# Patient Record
Sex: Male | Born: 1940 | Race: White | Hispanic: No | Marital: Married | Smoking: Former smoker
Health system: Southern US, Community
[De-identification: ages and names within clinical notes are randomized; demographics above are authoritative.]

## PROBLEM LIST (undated history)

## (undated) DIAGNOSIS — C801 Malignant (primary) neoplasm, unspecified: Secondary | ICD-10-CM

## (undated) DIAGNOSIS — J387 Other diseases of larynx: Secondary | ICD-10-CM

## (undated) DIAGNOSIS — J302 Other seasonal allergic rhinitis: Secondary | ICD-10-CM

## (undated) DIAGNOSIS — I351 Nonrheumatic aortic (valve) insufficiency: Secondary | ICD-10-CM

## (undated) DIAGNOSIS — Z87442 Personal history of urinary calculi: Secondary | ICD-10-CM

## (undated) DIAGNOSIS — Z86711 Personal history of pulmonary embolism: Secondary | ICD-10-CM

## (undated) DIAGNOSIS — D141 Benign neoplasm of larynx: Secondary | ICD-10-CM

## (undated) HISTORY — PX: TRANSTHORACIC ECHOCARDIOGRAM: SHX275

## (undated) HISTORY — PX: LARYNGOSCOPY: SHX5203

## (undated) HISTORY — PX: RADIOACTIVE SEED IMPLANT: SHX5150

## (undated) HISTORY — PX: COLONOSCOPY: SHX174

---

## 2004-12-16 DIAGNOSIS — C801 Malignant (primary) neoplasm, unspecified: Secondary | ICD-10-CM

## 2004-12-16 HISTORY — DX: Malignant (primary) neoplasm, unspecified: C80.1

## 2005-12-16 DIAGNOSIS — Z86711 Personal history of pulmonary embolism: Secondary | ICD-10-CM

## 2005-12-16 HISTORY — DX: Personal history of pulmonary embolism: Z86.711

## 2010-09-05 ENCOUNTER — Ambulatory Visit (HOSPITAL_COMMUNITY): Admission: RE | Admit: 2010-09-05 | Discharge: 2010-09-05 | Payer: Self-pay | Admitting: Otolaryngology

## 2011-02-28 LAB — CBC
MCH: 32.3 pg (ref 26.0–34.0)
Platelets: 165 10*3/uL (ref 150–400)
RBC: 4.52 MIL/uL (ref 4.22–5.81)
RDW: 13.1 % (ref 11.5–15.5)

## 2011-02-28 LAB — APTT: aPTT: 24 seconds (ref 24–37)

## 2011-02-28 LAB — SURGICAL PCR SCREEN
MRSA, PCR: NEGATIVE
Staphylococcus aureus: NEGATIVE

## 2011-02-28 LAB — COMPREHENSIVE METABOLIC PANEL
AST: 22 U/L (ref 0–37)
Albumin: 3.9 g/dL (ref 3.5–5.2)
Calcium: 9.3 mg/dL (ref 8.4–10.5)
Chloride: 107 mEq/L (ref 96–112)
Creatinine, Ser: 0.82 mg/dL (ref 0.4–1.5)
GFR calc Af Amer: >60 mL/min (ref >60)
Total Bilirubin: 0.9 mg/dL (ref 0.3–1.2)
Total Protein: 6.6 g/dL (ref 6.0–8.3)

## 2011-02-28 LAB — PROTIME-INR: INR: 0.94 (ref 0.00–1.49)

## 2012-05-14 ENCOUNTER — Encounter (HOSPITAL_BASED_OUTPATIENT_CLINIC_OR_DEPARTMENT_OTHER): Payer: Self-pay | Admitting: *Deleted

## 2012-05-14 NOTE — Progress Notes (Signed)
Pt on coumadin for hx 2 PE Will need pt ptt am surg-coming in 2 hr early since surg on a monday

## 2012-05-17 NOTE — H&P (Signed)
  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 Dysphonia (784.42). Discussed  His voice started getting much worse in the past month or 2. He is ready to schedule laser removal of the papilloma. Indirect examination reveals mobile vocal cords, with a papillomatous growth along the right anterior half of the cord, around the commissure anteriorly, and then involving the very anterior edge of the left cord. Recommend we scheduled him for laser laryngoscopy. Reason For Visit  Recheck rapsy voice. Allergies  Claritin TABS. Current Meds  Warfarin Sodium TABS;; RPT. 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 PMH  Prostate Cancer (V10.46); radiation therapy. Personal Hx  Alcohol Use; 2 drinks or less Never A Smoker. ROS  Otolaryngeal: Hoarseness. Vital Signs   Recorded by [facility] on 04 May 2012 02:04 PM BP:110/70,  Height: 71.5 in, Weight: 185 lb, BMI: 25.4 kg/m2,  BSA Calculated: 2.05 ,  BMI Calculated: 25.44. Signature  Electronically signed by : Serena Colonel  M.D.; 05/04/2012 5:31 PM EST. Electronically signed by : Serena Colonel  M.D.; 05/04/2012 5:32 PM EST.

## 2012-05-18 ENCOUNTER — Encounter (HOSPITAL_BASED_OUTPATIENT_CLINIC_OR_DEPARTMENT_OTHER)
Admission: RE | Disposition: A | Discharge status: Home / Self Care | Payer: Self-pay | Source: Ambulatory Visit | Attending: Otolaryngology

## 2012-05-18 ENCOUNTER — Ambulatory Visit (HOSPITAL_BASED_OUTPATIENT_CLINIC_OR_DEPARTMENT_OTHER): Payer: Medicare Other | Admitting: Anesthesiology

## 2012-05-18 ENCOUNTER — Encounter (HOSPITAL_BASED_OUTPATIENT_CLINIC_OR_DEPARTMENT_OTHER): Payer: Self-pay

## 2012-05-18 ENCOUNTER — Encounter (HOSPITAL_BASED_OUTPATIENT_CLINIC_OR_DEPARTMENT_OTHER): Payer: Self-pay | Admitting: Anesthesiology

## 2012-05-18 ENCOUNTER — Ambulatory Visit (HOSPITAL_BASED_OUTPATIENT_CLINIC_OR_DEPARTMENT_OTHER)
Admission: RE | Admit: 2012-05-18 | Discharge: 2012-05-18 | Disposition: A | Discharge status: Home / Self Care | Payer: Medicare Other | Source: Ambulatory Visit | Attending: Otolaryngology | Admitting: Otolaryngology

## 2012-05-18 DIAGNOSIS — J381 Polyp of vocal cord and larynx: Secondary | ICD-10-CM | POA: Insufficient documentation

## 2012-05-18 DIAGNOSIS — D141 Benign neoplasm of larynx: Secondary | ICD-10-CM | POA: Insufficient documentation

## 2012-05-18 DIAGNOSIS — R490 Dysphonia: Secondary | ICD-10-CM | POA: Insufficient documentation

## 2012-05-18 DIAGNOSIS — Z8546 Personal history of malignant neoplasm of prostate: Secondary | ICD-10-CM | POA: Insufficient documentation

## 2012-05-18 HISTORY — DX: Other seasonal allergic rhinitis: J30.2

## 2012-05-18 HISTORY — DX: Malignant (primary) neoplasm, unspecified: C80.1

## 2012-05-18 HISTORY — DX: Personal history of pulmonary embolism: Z86.711

## 2012-05-18 LAB — PROTIME-INR
INR: 2.2 — ABNORMAL HIGH (ref 0.00–1.49)
Prothrombin Time: 24.8 seconds — ABNORMAL HIGH (ref 11.6–15.2)

## 2012-05-18 SURGERY — MICROLARYNGOSCOPY WITH CO2 LASER AND EXCISION OF VOCAL CORD LESION
Anesthesia: General | Site: Throat | Wound class: Clean Contaminated

## 2012-05-18 MED ORDER — PROPOFOL 10 MG/ML IV EMUL
INTRAVENOUS | Status: DC | PRN
  Administered 2012-05-18: 150 mg via INTRAVENOUS

## 2012-05-18 MED ORDER — LACTATED RINGERS IV SOLN
INTRAVENOUS | Status: DC
  Administered 2012-05-18: 07:00:00 via INTRAVENOUS

## 2012-05-18 MED ORDER — ACETAMINOPHEN 325 MG PO TABS
325.0000 mg | ORAL_TABLET | Freq: Four times a day (QID) | ORAL | Status: DC | PRN
  Administered 2012-05-18: 650 mg via ORAL

## 2012-05-18 MED ORDER — DEXAMETHASONE SODIUM PHOSPHATE 4 MG/ML IJ SOLN
INTRAMUSCULAR | Status: DC | PRN
  Administered 2012-05-18: 10 mg via INTRAVENOUS

## 2012-05-18 MED ORDER — FENTANYL CITRATE 0.05 MG/ML IJ SOLN
INTRAMUSCULAR | Status: DC | PRN
  Administered 2012-05-18: 50 ug via INTRAVENOUS

## 2012-05-18 MED ORDER — LIDOCAINE HCL (CARDIAC) 20 MG/ML IV SOLN
INTRAVENOUS | Status: DC | PRN
  Administered 2012-05-18: 50 mg via INTRAVENOUS

## 2012-05-18 MED ORDER — LACTATED RINGERS IV SOLN
INTRAVENOUS | Status: DC | PRN
  Administered 2012-05-18 (×2): via INTRAVENOUS

## 2012-05-18 MED ORDER — ROCURONIUM BROMIDE 100 MG/10ML IV SOLN
INTRAVENOUS | Status: DC | PRN
  Administered 2012-05-18: 20 mg via INTRAVENOUS
  Administered 2012-05-18: 10 mg via INTRAVENOUS

## 2012-05-18 MED ORDER — GLYCOPYRROLATE 0.2 MG/ML IJ SOLN
INTRAMUSCULAR | Status: DC | PRN
  Administered 2012-05-18: 0.4 mg via INTRAVENOUS

## 2012-05-18 MED ORDER — MIDAZOLAM HCL 5 MG/5ML IJ SOLN
INTRAMUSCULAR | Status: DC | PRN
  Administered 2012-05-18: 2 mg via INTRAVENOUS

## 2012-05-18 MED ORDER — EPINEPHRINE HCL 1 MG/ML IJ SOLN
INTRAMUSCULAR | Status: DC | PRN
  Administered 2012-05-18: 1 mg via SUBCUTANEOUS

## 2012-05-18 MED ORDER — NEOSTIGMINE METHYLSULFATE 1 MG/ML IJ SOLN
INTRAMUSCULAR | Status: DC | PRN
  Administered 2012-05-18: 3.5 mg via INTRAVENOUS

## 2012-05-18 MED ORDER — FENTANYL CITRATE 0.05 MG/ML IJ SOLN: 25.0000 ug | INTRAMUSCULAR | Status: DC | PRN

## 2012-05-18 MED ORDER — SUCCINYLCHOLINE CHLORIDE 20 MG/ML IJ SOLN
INTRAMUSCULAR | Status: DC | PRN
  Administered 2012-05-18: 100 mg via INTRAVENOUS

## 2012-05-18 MED ORDER — ONDANSETRON HCL 4 MG/2ML IJ SOLN
INTRAMUSCULAR | Status: DC | PRN
  Administered 2012-05-18: 4 mg via INTRAVENOUS

## 2012-05-18 SURGICAL SUPPLY — 25 items
CANISTER SUCTION 1200CC (MISCELLANEOUS) ×2 IMPLANT
CLOTH BEACON ORANGE TIMEOUT ST (SAFETY) ×2 IMPLANT
GAUZE SPONGE 4X4 12PLY STRL LF (GAUZE/BANDAGES/DRESSINGS) ×4 IMPLANT
GLOVE ECLIPSE 7.5 STRL STRAW (GLOVE) ×2 IMPLANT
GLOVE SKINSENSE NS SZ7.0 (GLOVE) ×1
GLOVE SKINSENSE STRL SZ7.0 (GLOVE) ×1 IMPLANT
GOWN PREVENTION PLUS XLARGE (GOWN DISPOSABLE) IMPLANT
GOWN PREVENTION PLUS XXLARGE (GOWN DISPOSABLE) IMPLANT
GUARD TEETH (MISCELLANEOUS) ×4 IMPLANT
MARKER SKIN DUAL TIP RULER LAB (MISCELLANEOUS) IMPLANT
NEEDLE HYPO 18GX1.5 BLUNT FILL (NEEDLE) IMPLANT
NEEDLE SPNL 22GX7 QUINCKE BK (NEEDLE) IMPLANT
NS IRRIG 1000ML POUR BTL (IV SOLUTION) ×2 IMPLANT
PATTIES SURGICAL .5 X3 (DISPOSABLE) ×2 IMPLANT
REDUCTION FITTING 1/4 IN (FILTER) ×2 IMPLANT
SHEET MEDIUM DRAPE 40X70 STRL (DRAPES) ×2 IMPLANT
SLEEVE SCD COMPRESS KNEE MED (MISCELLANEOUS) ×2 IMPLANT
SOLUTION BUTLER CLEAR DIP (MISCELLANEOUS) ×2 IMPLANT
SURGILUBE 2OZ TUBE FLIPTOP (MISCELLANEOUS) IMPLANT
SYR 5ML LL (SYRINGE) IMPLANT
SYR CONTROL 10ML LL (SYRINGE) IMPLANT
SYR TB 1ML LL NO SAFETY (SYRINGE) ×2 IMPLANT
TOWEL OR 17X24 6PK STRL BLUE (TOWEL DISPOSABLE) IMPLANT
TUBE CONNECTING 20X1/4 (TUBING) ×2 IMPLANT
WATER STERILE IRR 1000ML POUR (IV SOLUTION) IMPLANT

## 2012-05-18 NOTE — Progress Notes (Signed)
Notified Dr. Pollyann Kennedy no script for pain on chart on charted -- use tylenol or ibuprofen for pain) Pt will use tylenol due to pt on coumadin.

## 2012-05-18 NOTE — Anesthesia Preprocedure Evaluation (Signed)
Anesthesia Evaluation  Patient identified by MRN, date of birth, ID band Patient awake    Reviewed: Allergy & Precautions, H&P , NPO status , Patient's Chart, lab work & pertinent test results  Airway Mallampati: II TM Distance: >3 FB Neck ROM: Full    Dental No notable dental hx. (+) Teeth Intact and Dental Advisory Given   Pulmonary neg pulmonary ROS,  breath sounds clear to auscultation  Pulmonary exam normal       Cardiovascular negative cardio ROS  Rhythm:Regular Rate:Normal     Neuro/Psych negative neurological ROS  negative psych ROS   GI/Hepatic negative GI ROS, Neg liver ROS,   Endo/Other  negative endocrine ROS  Renal/GU negative Renal ROS  negative genitourinary   Musculoskeletal   Abdominal   Peds  Hematology negative hematology ROS (+)   Anesthesia Other Findings   Reproductive/Obstetrics negative OB ROS                           Anesthesia Physical Anesthesia Plan  ASA: II  Anesthesia Plan: General   Post-op Pain Management:    Induction: Intravenous  Airway Management Planned: Oral ETT  Additional Equipment:   Intra-op Plan:   Post-operative Plan: Extubation in OR  Informed Consent: I have reviewed the patients History and Physical, chart, labs and discussed the procedure including the risks, benefits and alternatives for the proposed anesthesia with the patient or authorized representative who has indicated his/her understanding and acceptance.   Dental advisory given  Plan Discussed with: CRNA  Anesthesia Plan Comments:        Anesthesia Quick Evaluation  

## 2012-05-18 NOTE — Transfer of Care (Signed)
Immediate Anesthesia Transfer of Care Note  Patient: Kent Davis  Procedure(s) Performed: Procedure(s) (LRB): MICROLARYNGOSCOPY WITH CO2 LASER AND EXCISION OF VOCAL CORD LESION (N/A)  Patient Location: PACU  Anesthesia Type: General  Level of Consciousness: awake  Airway & Oxygen Therapy: Patient Spontanous Breathing and Patient connected to face mask oxygen  Post-op Assessment: Report given to PACU RN and Post -op Vital signs reviewed and stable  Post vital signs: Reviewed and stable  Complications: No apparent anesthesia complications

## 2012-05-18 NOTE — Interval H&P Note (Signed)
History and Physical Interval Note:  05/18/2012 7:31 AM  Kent Davis  has presented today for surgery, with the diagnosis of vocal cord polyp  The various methods of treatment have been discussed with the patient and family. After consideration of risks, benefits and other options for treatment, the patient has consented to  Procedure(s) (LRB): MICROLARYNGOSCOPY WITH CO2 LASER AND EXCISION OF VOCAL CORD LESION (N/A) as a surgical intervention .  The patients' history has been reviewed, patient examined, no change in status, stable for surgery.  I have reviewed the patients' chart and labs.  Questions were answered to the patient's satisfaction.     Brennen Camper

## 2012-05-18 NOTE — Discharge Instructions (Addendum)
Rest your voice for the next 2 weeks, no screaming, whispering or straining. You may cough at a gentle and comfortable level  but not  Excessively.   Post Anesthesia Home Care Instructions  Activity: Get plenty of rest for the remainder of the day. A responsible adult should stay with you for 24 hours following the procedure.  For the next 24 hours, DO NOT: -Drive a car -Advertising copywriter -Drink alcoholic beverages -Take any medication unless instructed by your physician -Make any legal decisions or sign important papers.  Meals: Start with liquid foods such as gelatin or soup. Progress to regular foods as tolerated. Avoid greasy, spicy, heavy foods. If nausea and/or vomiting occur, drink only clear liquids until the nausea and/or vomiting subsides. Call your physician if vomiting continues.  Special Instructions/Symptoms: Your throat may feel dry or sore from the anesthesia or the breathing tube placed in your throat during surgery. If this causes discomfort, gargle with warm salt water. The discomfort should disappear within 24 hours.

## 2012-05-18 NOTE — Op Note (Signed)
OPERATIVE REPORT  DATE OF SURGERY: 05/18/2012  PATIENT:  Kent Davis,  71 y.o. male  PRE-OPERATIVE DIAGNOSIS:  vocal cord polyp  POST-OPERATIVE DIAGNOSIS:  vocal cord polyp  PROCEDURE:  Procedure(s): MICROLARYNGOSCOPY WITH CO2 LASER AND EXCISION OF VOCAL CORD LESION  SURGEON:  Susy Frizzle, MD  ASSISTANTS: none   ANESTHESIA:   general  EBL:  0 ml  DRAINS: none   LOCAL MEDICATIONS USED:  OTHER adrenaline solution, 12-998.  SPECIMEN:  Source of Specimen:  Right vocal cord  COUNTS:  YES  PROCEDURE DETAILS: Patient was taken to the operating room and placed on the operating table in the supine position. Following induction of general endotracheal anesthesia, the table was turned 90 and drapes were applied including saline soaked eye pads and facial towels. Maxillary and mandibular tooth protectors were used. A laser safe laryngoscope was entered into the oral cavity used to visualize the larynx and secured to the [place] stand using the suspension apparatus. A laser safe tube was used throughout the case. The microscope was used to visualize the larynx. There was papillomatous type growth along the right anterior half of the vocal cord, slightly into the subglottis, and into the floor of the ventricle on the right side. A biopsy was taken and sent for pathologic evaluation. The carbon dioxide laser was used at a setting of 4 W continuous power to ablate all of the visible papillomatous tissue. Topical adrenaline was used as needed. The patient was then awakened, extubated and transferred to recovery in stable condition.   PATIENT DISPOSITION:  PACU - hemodynamically stable.

## 2012-05-18 NOTE — Anesthesia Postprocedure Evaluation (Signed)
  Anesthesia Post-op Note  Patient: Kent Davis  Procedure(s) Performed: Procedure(s) (LRB): MICROLARYNGOSCOPY WITH CO2 LASER AND EXCISION OF VOCAL CORD LESION (N/A)  Patient Location: PACU  Anesthesia Type: General  Level of Consciousness: awake  Airway and Oxygen Therapy: Patient Spontanous Breathing and aerosol face mask  Post-op Pain: none  Post-op Assessment: Post-op Vital signs reviewed, Patient's Cardiovascular Status Stable, Respiratory Function Stable and Patent Airway  Post-op Vital Signs: Reviewed and stable  Complications: No apparent anesthesia complications

## 2012-08-12 ENCOUNTER — Inpatient Hospital Stay (HOSPITAL_COMMUNITY): Admit: 2012-08-12 | Payer: Self-pay

## 2012-08-14 ENCOUNTER — Other Ambulatory Visit: Payer: Self-pay | Admitting: Internal Medicine

## 2012-08-14 DIAGNOSIS — E041 Nontoxic single thyroid nodule: Secondary | ICD-10-CM

## 2012-08-19 ENCOUNTER — Ambulatory Visit
Admission: RE | Admit: 2012-08-19 | Discharge: 2012-08-19 | Disposition: A | Discharge status: Home / Self Care | Payer: Medicare Other | Source: Ambulatory Visit | Attending: Internal Medicine | Admitting: Internal Medicine

## 2012-08-19 DIAGNOSIS — E041 Nontoxic single thyroid nodule: Secondary | ICD-10-CM

## 2012-08-25 ENCOUNTER — Other Ambulatory Visit: Payer: Self-pay | Admitting: Internal Medicine

## 2012-08-25 DIAGNOSIS — E042 Nontoxic multinodular goiter: Secondary | ICD-10-CM

## 2012-09-16 ENCOUNTER — Other Ambulatory Visit (HOSPITAL_COMMUNITY)
Admission: RE | Admit: 2012-09-16 | Discharge: 2012-09-16 | Disposition: A | Discharge status: Home / Self Care | Payer: Medicare Other | Source: Ambulatory Visit | Attending: Interventional Radiology | Admitting: Interventional Radiology

## 2012-09-16 ENCOUNTER — Telehealth: Payer: Self-pay | Admitting: Emergency Medicine

## 2012-09-16 ENCOUNTER — Ambulatory Visit
Admission: RE | Admit: 2012-09-16 | Discharge: 2012-09-16 | Disposition: A | Discharge status: Home / Self Care | Payer: Medicare Other | Source: Ambulatory Visit | Attending: Internal Medicine | Admitting: Internal Medicine

## 2012-09-16 DIAGNOSIS — E049 Nontoxic goiter, unspecified: Secondary | ICD-10-CM | POA: Insufficient documentation

## 2012-09-16 DIAGNOSIS — E042 Nontoxic multinodular goiter: Secondary | ICD-10-CM

## 2012-09-16 NOTE — Telephone Encounter (Signed)
Kent Davis W/ [facility] NEXT DOOR CALLED IN THE INR- 1.05  PT- 13.3

## 2014-11-24 ENCOUNTER — Ambulatory Visit: Payer: Self-pay | Admitting: Otolaryngology

## 2014-11-24 NOTE — H&P (Signed)
  Assessment  Anticoagulated on Coumadin (V58.83,V58.61) (Z51.81,Z79.01). 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 Discussed  He has been getting quite hoarse again. He takes Coumadin because he had pulmonary embolus twice in the past. On exam, his voice is strained and hoarse. He has no stridor. Oral cavity and pharynx look normal. Indirect exam of the larynx reveals papilloma involving the midportion of the left cord and the anterior fourth bilaterally. Otherwise normal exam. Recommend laser excision. We will talk to his primary care about stopping the Coumadin for one week. Reason For Visit  Recheck vocal cords. Allergies  Claritin TABS. Current Meds  Warfarin Sodium TABS;; RPT. 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 History of Polyp of vocal cord or larynx (478.4) (J38.1); Resolved: 13Nov2015. PSH  Laryngoscopy (Diagnostic) 03Jun2013; removal vocal cord polyp. Family Hx  No pertinent family history: Mother. Personal Hx  Alcohol Use (History); 2 drinks or less Caffeine use (V49.89) (F15.90); 2 cups daily Never a smoker No alcohol use 13Nov2015. Vital Signs   Recorded by Skolimowski,Sharon on 28 Oct 2014 02:05 PM BP:110/60,  Height: 5 ft 11.5 in, Weight: 186 lb , BMI: 25.6 kg/m2,  BMI Calculated: 25.58 ,  BSA Calculated: 2.06. Signature  Electronically signed by : Shamila Lerch  M.D.; 10/28/2014 2:19 PM EST.  

## 2014-12-05 ENCOUNTER — Encounter (HOSPITAL_COMMUNITY): Payer: Self-pay | Admitting: *Deleted

## 2014-12-05 NOTE — Progress Notes (Signed)
I received a fax back that read,"need signed release".  I called Dr Roddie Mc office and explained that patient's surgery is in am and that Dr Gregary Signs sent a release, we need EKG, Echo and stress test- patient reported all were done this year.  I was told that they will call patient to get permission and then fax the records.

## 2014-12-05 NOTE — Progress Notes (Signed)
PCP is Dr Julio Sicks in [place].  Mr Squillace reports having an ekg, stress test and echo at PCP's office this year, I requested reports.

## 2014-12-06 ENCOUNTER — Ambulatory Visit (HOSPITAL_COMMUNITY): Payer: Medicare PPO | Admitting: Certified Registered Nurse Anesthetist

## 2014-12-06 ENCOUNTER — Encounter (HOSPITAL_COMMUNITY): Payer: Self-pay

## 2014-12-06 ENCOUNTER — Encounter (HOSPITAL_COMMUNITY)
Admission: RE | Disposition: A | Discharge status: Home / Self Care | Payer: Self-pay | Source: Ambulatory Visit | Attending: Otolaryngology

## 2014-12-06 ENCOUNTER — Ambulatory Visit (HOSPITAL_COMMUNITY)
Admission: RE | Admit: 2014-12-06 | Discharge: 2014-12-06 | Disposition: A | Discharge status: Home / Self Care | Payer: Medicare PPO | Source: Ambulatory Visit | Attending: Otolaryngology | Admitting: Otolaryngology

## 2014-12-06 DIAGNOSIS — D141 Benign neoplasm of larynx: Secondary | ICD-10-CM | POA: Insufficient documentation

## 2014-12-06 DIAGNOSIS — Z8546 Personal history of malignant neoplasm of prostate: Secondary | ICD-10-CM | POA: Insufficient documentation

## 2014-12-06 DIAGNOSIS — Z923 Personal history of irradiation: Secondary | ICD-10-CM | POA: Diagnosis not present

## 2014-12-06 DIAGNOSIS — Z7901 Long term (current) use of anticoagulants: Secondary | ICD-10-CM | POA: Diagnosis not present

## 2014-12-06 DIAGNOSIS — Z86711 Personal history of pulmonary embolism: Secondary | ICD-10-CM | POA: Insufficient documentation

## 2014-12-06 HISTORY — PX: MICROLARYNGOSCOPY: SHX5208

## 2014-12-06 LAB — CBC
HCT: 43.5 % (ref 39.0–52.0)
HEMOGLOBIN: 15 g/dL (ref 13.0–17.0)
MCH: 31.9 pg (ref 26.0–34.0)
MCHC: 34.5 g/dL (ref 30.0–36.0)
MCV: 92.6 fL (ref 78.0–100.0)
Platelets: 182 10*3/uL (ref 150–400)
RBC: 4.7 MIL/uL (ref 4.22–5.81)
RDW: 12.9 % (ref 11.5–15.5)
WBC: 4.2 10*3/uL (ref 4.0–10.5)

## 2014-12-06 LAB — PROTIME-INR
INR: 1 (ref 0.00–1.49)
PROTHROMBIN TIME: 13.3 s (ref 11.6–15.2)

## 2014-12-06 SURGERY — MICROLARYNGOSCOPY
Anesthesia: General | Laterality: Bilateral

## 2014-12-06 MED ORDER — LACTATED RINGERS IV SOLN
INTRAVENOUS | Status: DC | PRN
  Administered 2014-12-06: 08:00:00 via INTRAVENOUS

## 2014-12-06 MED ORDER — OXYCODONE HCL 5 MG/5ML PO SOLN: 5.0000 mg | Freq: Once | ORAL | Status: DC | PRN

## 2014-12-06 MED ORDER — ONDANSETRON HCL 4 MG/2ML IJ SOLN
INTRAMUSCULAR | Status: AC
  Filled 2014-12-06: qty 2

## 2014-12-06 MED ORDER — OXYCODONE HCL 5 MG PO TABS: 5.0000 mg | ORAL_TABLET | Freq: Once | ORAL | Status: DC | PRN

## 2014-12-06 MED ORDER — MIDAZOLAM HCL 2 MG/2ML IJ SOLN
INTRAMUSCULAR | Status: AC
  Filled 2014-12-06: qty 2

## 2014-12-06 MED ORDER — LIDOCAINE HCL (CARDIAC) 20 MG/ML IV SOLN
INTRAVENOUS | Status: AC
  Filled 2014-12-06: qty 5

## 2014-12-06 MED ORDER — GLYCOPYRROLATE 0.2 MG/ML IJ SOLN
INTRAMUSCULAR | Status: DC | PRN
  Administered 2014-12-06: 0.4 mg via INTRAVENOUS

## 2014-12-06 MED ORDER — SUCCINYLCHOLINE CHLORIDE 20 MG/ML IJ SOLN
INTRAMUSCULAR | Status: DC | PRN
  Administered 2014-12-06: 100 mg via INTRAVENOUS

## 2014-12-06 MED ORDER — LIDOCAINE-EPINEPHRINE 1 %-1:100000 IJ SOLN
INTRAMUSCULAR | Status: AC
  Filled 2014-12-06: qty 1

## 2014-12-06 MED ORDER — TRIAMCINOLONE ACETONIDE 40 MG/ML IJ SUSP
INTRAMUSCULAR | Status: AC
  Filled 2014-12-06: qty 5

## 2014-12-06 MED ORDER — ONDANSETRON HCL 4 MG/2ML IJ SOLN: 4.0000 mg | Freq: Once | INTRAMUSCULAR | Status: DC | PRN

## 2014-12-06 MED ORDER — EPHEDRINE SULFATE 50 MG/ML IJ SOLN
INTRAMUSCULAR | Status: AC
  Filled 2014-12-06: qty 1

## 2014-12-06 MED ORDER — NEOSTIGMINE METHYLSULFATE 10 MG/10ML IV SOLN
INTRAVENOUS | Status: AC
  Filled 2014-12-06: qty 1

## 2014-12-06 MED ORDER — LIDOCAINE HCL (CARDIAC) 20 MG/ML IV SOLN
INTRAVENOUS | Status: DC | PRN
  Administered 2014-12-06: 50 mg via INTRAVENOUS

## 2014-12-06 MED ORDER — FENTANYL CITRATE 0.05 MG/ML IJ SOLN
25.0000 ug | INTRAMUSCULAR | Status: DC | PRN
Start: 2014-12-06 — End: 2014-12-06

## 2014-12-06 MED ORDER — PROPOFOL 10 MG/ML IV BOLUS
INTRAVENOUS | Status: AC
  Filled 2014-12-06: qty 20

## 2014-12-06 MED ORDER — EPINEPHRINE HCL (NASAL) 0.1 % NA SOLN
NASAL | Status: AC
  Filled 2014-12-06: qty 30

## 2014-12-06 MED ORDER — DEXAMETHASONE SODIUM PHOSPHATE 10 MG/ML IJ SOLN
INTRAMUSCULAR | Status: AC
  Filled 2014-12-06: qty 1

## 2014-12-06 MED ORDER — FENTANYL CITRATE 0.05 MG/ML IJ SOLN
INTRAMUSCULAR | Status: DC | PRN
Start: 2014-12-06 — End: 2014-12-06
  Administered 2014-12-06: 100 ug via INTRAVENOUS
  Administered 2014-12-06: 25 ug via INTRAVENOUS

## 2014-12-06 MED ORDER — SODIUM CHLORIDE 0.9 % IJ SOLN
INTRAMUSCULAR | Status: AC
  Filled 2014-12-06: qty 10

## 2014-12-06 MED ORDER — NEOSTIGMINE METHYLSULFATE 10 MG/10ML IV SOLN
INTRAVENOUS | Status: DC | PRN
Start: 2014-12-06 — End: 2014-12-06
  Administered 2014-12-06: 3 mg via INTRAVENOUS

## 2014-12-06 MED ORDER — ONDANSETRON HCL 4 MG/2ML IJ SOLN
INTRAMUSCULAR | Status: DC | PRN
  Administered 2014-12-06: 4 mg via INTRAVENOUS

## 2014-12-06 MED ORDER — DEXAMETHASONE SODIUM PHOSPHATE 10 MG/ML IJ SOLN
INTRAMUSCULAR | Status: DC | PRN
  Administered 2014-12-06: 10 mg via INTRAVENOUS

## 2014-12-06 MED ORDER — ROCURONIUM BROMIDE 50 MG/5ML IV SOLN
INTRAVENOUS | Status: AC
  Filled 2014-12-06: qty 1

## 2014-12-06 MED ORDER — ROCURONIUM BROMIDE 100 MG/10ML IV SOLN
INTRAVENOUS | Status: DC | PRN
  Administered 2014-12-06: 15 mg via INTRAVENOUS

## 2014-12-06 MED ORDER — 0.9 % SODIUM CHLORIDE (POUR BTL) OPTIME
TOPICAL | Status: DC | PRN
  Administered 2014-12-06: 1000 mL

## 2014-12-06 MED ORDER — PROPOFOL 10 MG/ML IV BOLUS
INTRAVENOUS | Status: DC | PRN
  Administered 2014-12-06: 50 mg via INTRAVENOUS
  Administered 2014-12-06: 200 mg via INTRAVENOUS

## 2014-12-06 MED ORDER — FENTANYL CITRATE 0.05 MG/ML IJ SOLN
INTRAMUSCULAR | Status: AC
  Filled 2014-12-06: qty 5

## 2014-12-06 MED ORDER — PHENYLEPHRINE HCL 10 MG/ML IJ SOLN
INTRAMUSCULAR | Status: DC | PRN
  Administered 2014-12-06: 40 ug via INTRAVENOUS

## 2014-12-06 MED ORDER — EPINEPHRINE HCL (NASAL) 0.1 % NA SOLN
NASAL | Status: DC | PRN
  Administered 2014-12-06: 30 mL via TOPICAL

## 2014-12-06 MED ORDER — GLYCOPYRROLATE 0.2 MG/ML IJ SOLN
INTRAMUSCULAR | Status: AC
  Filled 2014-12-06: qty 3

## 2014-12-06 SURGICAL SUPPLY — 31 items
CANISTER SUCTION 2500CC (MISCELLANEOUS) ×3 IMPLANT
CONT SPEC 4OZ CLIKSEAL STRL BL (MISCELLANEOUS) ×6 IMPLANT
COVER MAYO STAND STRL (DRAPES) ×3 IMPLANT
COVER TABLE BACK 60X90 (DRAPES) ×3 IMPLANT
DRAPE PROXIMA HALF (DRAPES) ×3 IMPLANT
DRESSING TELFA 8X3 (GAUZE/BANDAGES/DRESSINGS) IMPLANT
ELECT REM PT RETURN 9FT ADLT (ELECTROSURGICAL)
ELECTRODE REM PT RTRN 9FT ADLT (ELECTROSURGICAL) IMPLANT
GAUZE SPONGE 4X4 16PLY XRAY LF (GAUZE/BANDAGES/DRESSINGS) ×3 IMPLANT
GLOVE ECLIPSE 7.5 STRL STRAW (GLOVE) ×3 IMPLANT
GUARD TEETH (MISCELLANEOUS) ×3 IMPLANT
KIT BASIN OR (CUSTOM PROCEDURE TRAY) ×3 IMPLANT
KIT ROOM TURNOVER OR (KITS) ×3 IMPLANT
MARKER SKIN DUAL TIP RULER LAB (MISCELLANEOUS) IMPLANT
NEEDLE HYPO 25GX1X1/2 BEV (NEEDLE) ×3 IMPLANT
NS IRRIG 1000ML POUR BTL (IV SOLUTION) ×3 IMPLANT
PAD ARMBOARD 7.5X6 YLW CONV (MISCELLANEOUS) ×6 IMPLANT
PAD EYE OVAL STERILE LF (GAUZE/BANDAGES/DRESSINGS) ×6 IMPLANT
PATTIES SURGICAL .5 X1 (DISPOSABLE) ×3 IMPLANT
PREFILTER SMOKE EVAC (FILTER) ×3 IMPLANT
SOLUTION ANTI FOG 6CC (MISCELLANEOUS) ×3 IMPLANT
SPECIMEN JAR SMALL (MISCELLANEOUS) ×3 IMPLANT
SYR 3ML LL SCALE MARK (SYRINGE) IMPLANT
SYR CONTROL 10ML LL (SYRINGE) IMPLANT
SYR TB 1ML LUER SLIP (SYRINGE) IMPLANT
TOWEL OR 17X24 6PK STRL BLUE (TOWEL DISPOSABLE) ×6 IMPLANT
TUBE CONNECTING 12'X1/4 (SUCTIONS) ×1
TUBE CONNECTING 12X1/4 (SUCTIONS) ×2 IMPLANT
TUBE CONNECTING 20'X1/4 (TUBING) ×1
TUBE CONNECTING 20X1/4 (TUBING) ×2 IMPLANT
WATER STERILE IRR 1000ML POUR (IV SOLUTION) ×3 IMPLANT

## 2014-12-06 NOTE — Anesthesia Preprocedure Evaluation (Addendum)
Anesthesia Evaluation  Patient identified by MRN, date of birth, ID band Patient awake    Reviewed: Allergy & Precautions, H&P , NPO status , Patient's Chart, lab work & pertinent test results  Airway Mallampati: II  TM Distance: >3 FB Neck ROM: Full    Dental  (+) Teeth Intact, Dental Advisory Given   Pulmonary former smoker,  breath sounds clear to auscultation        Cardiovascular Rhythm:Regular Rate:Normal     Neuro/Psych    GI/Hepatic   Endo/Other    Renal/GU      Musculoskeletal   Abdominal   Peds  Hematology   Anesthesia Other Findings   Reproductive/Obstetrics                            Anesthesia Physical Anesthesia Plan  ASA: III  Anesthesia Plan: General   Post-op Pain Management:    Induction: Intravenous  Airway Management Planned: Oral ETT  Additional Equipment:   Intra-op Plan:   Post-operative Plan: Extubation in OR  Informed Consent: I have reviewed the patients History and Physical, chart, labs and discussed the procedure including the risks, benefits and alternatives for the proposed anesthesia with the patient or authorized representative who has indicated his/her understanding and acceptance.   Dental advisory given  Plan Discussed with: CRNA and Anesthesiologist  Anesthesia Plan Comments:         Anesthesia Quick Evaluation

## 2014-12-06 NOTE — Op Note (Signed)
OPERATIVE REPORT  DATE OF SURGERY: 12/06/2014  PATIENT:  Kent Davis,  73 y.o. male  PRE-OPERATIVE DIAGNOSIS:  VOCAL CORD PAPILLOMA  POST-OPERATIVE DIAGNOSIS:  VOCAL CORD PAPILLOMA  PROCEDURE:  Procedure(s): MICROLARYNGOSCOPY WITH REMOVAL OF PAPILLOMA  SURGEON:  Beckie Salts, MD  ASSISTANTS: none  ANESTHESIA:   General   EBL:  3 ml  DRAINS: none  LOCAL MEDICATIONS USED:  None  SPECIMEN:  Laryngeal mass  COUNTS:  Correct  PROCEDURE DETAILS: The patient was taken to the operating room and placed on the operating table in the supine position. Following induction of general endotracheal anesthesia, the table was turned 90 and the patient was draped in a standard fashion. Saline soaked towels and eye pads were placed around the face and eyes respectively. A laser laryngoscope was entered into the oral cavity. Maxillary tooth protector was used throughout the case. The scope was used to visualize the larynx and attached to the [place] stand with the suspension apparatus. The operating microscope was brought into view. The endotracheal tube was intermittently removed and replaced through the laryngoscope in order to work in an apneic technique. The CO2 laser was used at a setting of 3 W continuous power to ablate all of the papillomatous tissue that was seen arising from the left anterior vocal cord, anterior commissure, anterior ventricle. A couple of small pieces were sent for pathologic evaluation. Topical adrenaline was used at the end of the case for hemostasis. Fairly bulky papilloma was removed especially in the anterior 2 mm of the cord. All of the visible papilloma was removed. The scope was removed. Patient was awakened extubated and transferred to recovery in stable condition.    PATIENT DISPOSITION:  To PACU, stable

## 2014-12-06 NOTE — Progress Notes (Signed)
Called Dr.Joslin for sign out ---okay to go home at this time

## 2014-12-06 NOTE — Transfer of Care (Signed)
Immediate Anesthesia Transfer of Care Note  Patient: Kent Davis  Procedure(s) Performed: Procedure(s): MICROLARYNGOSCOPY WITH REMOVAL OF PAPILLOMA (Bilateral)  Patient Location: PACU  Anesthesia Type:General  Level of Consciousness: awake, alert , oriented and patient cooperative  Airway & Oxygen Therapy: Patient Spontanous Breathing and Patient connected to nasal cannula oxygen  Post-op Assessment: Report given to PACU RN, Post -op Vital signs reviewed and stable and Patient moving all extremities  Post vital signs: Reviewed and stable  Complications: No apparent anesthesia complications

## 2014-12-06 NOTE — Interval H&P Note (Signed)
History and Physical Interval Note:  12/06/2014 8:34 AM  Kent Davis  has presented today for surgery, with the diagnosis of VOCAL CORD PAPILLOMA  The various methods of treatment have been discussed with the patient and family. After consideration of risks, benefits and other options for treatment, the patient has consented to  Procedure(s): MICROLARYNGOSCOPY WITH REMOVAL OF PAPILLOMA (Bilateral) as a surgical intervention .  The patient's history has been reviewed, patient examined, no change in status, stable for surgery.  I have reviewed the patient's chart and labs.  Questions were answered to the patient's satisfaction.     Jemia Fata

## 2014-12-06 NOTE — Anesthesia Postprocedure Evaluation (Signed)
  Anesthesia Post-op Note  Patient: Kent Davis  Procedure(s) Performed: Procedure(s): MICROLARYNGOSCOPY WITH REMOVAL OF PAPILLOMA (Bilateral)  Patient Location: PACU  Anesthesia Type:General  Level of Consciousness: awake, alert  and oriented  Airway and Oxygen Therapy: Patient Spontanous Breathing and Patient connected to nasal cannula oxygen  Post-op Pain: mild  Post-op Assessment: Post-op Vital signs reviewed, Patient's Cardiovascular Status Stable, Respiratory Function Stable, Patent Airway, No signs of Nausea or vomiting and Pain level controlled  Post-op Vital Signs: stable  Last Vitals:  Filed Vitals:   12/06/14 1049  BP: 133/75  Pulse: 71  Temp:   Resp: 22    Complications: No apparent anesthesia complications

## 2014-12-06 NOTE — Anesthesia Procedure Notes (Signed)
Procedure Name: Intubation Date/Time: 12/06/2014 8:48 AM Performed by: Shirlyn Goltz Pre-anesthesia Checklist: Patient identified, Emergency Drugs available, Suction available and Patient being monitored Patient Re-evaluated:Patient Re-evaluated prior to inductionOxygen Delivery Method: Circle system utilized Preoxygenation: Pre-oxygenation with 100% oxygen Intubation Type: IV induction Ventilation: Mask ventilation without difficulty Laryngoscope Size: Miller and 3 Grade View: Grade I Tube type: Oral Tube size: 6.0 mm Number of attempts: 1 Airway Equipment and Method: Stylet Placement Confirmation: ETT inserted through vocal cords under direct vision,  positive ETCO2 and breath sounds checked- equal and bilateral Secured at: 22 cm Tube secured with: Tape Dental Injury: Teeth and Oropharynx as per pre-operative assessment

## 2014-12-06 NOTE — H&P (View-Only) (Signed)
  Assessment  Anticoagulated on Coumadin (V58.83,V58.61) (Z51.81,Z79.01). 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 Discussed  He has been getting quite hoarse again. He takes Coumadin because he had pulmonary embolus twice in the past. On exam, his voice is strained and hoarse. He has no stridor. Oral cavity and pharynx look normal. Indirect exam of the larynx reveals papilloma involving the midportion of the left cord and the anterior fourth bilaterally. Otherwise normal exam. Recommend laser excision. We will talk to his primary care about stopping the Coumadin for one week. Reason For Visit  Recheck vocal cords. Allergies  Claritin TABS. Current Meds  Warfarin Sodium TABS;; RPT. 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 History of Polyp of vocal cord or larynx (478.4) (J38.1); Resolved: [id number]. PSH  Laryngoscopy (Diagnostic) [id number]; removal vocal cord polyp. Family Hx  No pertinent family history: Mother. Personal Hx  Alcohol Use (History); 2 drinks or less Caffeine use (V49.89) (F15.90); 2 cups daily Never a smoker No alcohol use [id number]. Vital Signs   Recorded by [facility] on 28 Oct 2014 02:05 PM BP:110/60,  Height: 5 ft 11.5 in, Weight: 186 lb , BMI: 25.6 kg/m2,  BMI Calculated: 25.58 ,  BSA Calculated: 2.06. Signature  Electronically signed by : Izora Gala  M.D.; 10/28/2014 2:19 PM EST.

## 2014-12-07 ENCOUNTER — Encounter (HOSPITAL_COMMUNITY): Payer: Self-pay | Admitting: Otolaryngology

## 2014-12-07 NOTE — Discharge Summary (Signed)
  Physician Discharge Summary  Patient ID: Kent Davis MRN: [id number] DOB/AGE: [birth year] 73 y.o.  Admit date: 12/06/2014 Discharge date: 12/07/2014  Admission Diagnoses:Laryngeal papilloma  Discharge Diagnoses:  Active Problems:   * No active hospital problems. *   Discharged Condition: good  Hospital Course: no complications  Consults: none  Significant Diagnostic Studies: none  Treatments: surgery: Laser laryngoscopy  Discharge Exam: Blood pressure 133/75, pulse 71, temperature 97.2 F (36.2 C), temperature source Oral, resp. rate 22, height 6\' 1"  (1.854 m), weight 84.823 kg (187 lb), SpO2 100 %. PHYSICAL EXAM: Awake and alert, no breathing difficulty.  Disposition: 01-Home or Self Care  Discharge Instructions    Diet - low sodium heart healthy    Complete by:  As directed      Increase activity slowly    Complete by:  As directed             Medication List    TAKE these medications        loratadine 10 MG tablet  Commonly known as:  CLARITIN  Take 10 mg by mouth daily.     warfarin 6 MG tablet  Commonly known as:  COUMADIN  Take 3-6 mg by mouth daily. 3mg  daily on Monday, Wednesday and Friday and 6mg  daily on Tuesday, Thursday, Saturday, and Sunday           Follow-up Information    Follow up with Izora Gala, MD.   Specialty:  Otolaryngology   Why:  As needed   Contact information:   [address] [place] [address] [phone]       Signed: Izora Gala 12/07/2014, 11:41 AM

## 2017-02-15 ENCOUNTER — Ambulatory Visit: Payer: Self-pay | Admitting: Otolaryngology

## 2017-02-15 NOTE — H&P (Signed)
  Kent Davis is an 75 y.o. male.   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 Getting very hoarse again, no trouble breathing.  Past Medical History:  Diagnosis Date  . 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 Hx pulmonary embolism 2007   x2-once long plane flight to [place],other playing tennis  . Seasonal allergies     Past Surgical History:  Procedure Laterality Date  . COLONOSCOPY    . LARYNGOSCOPY  9/11 and 2013   vocal cord polyp  . MICROLARYNGOSCOPY Bilateral 12/06/2014   Procedure: MICROLARYNGOSCOPY WITH REMOVAL OF PAPILLOMA;  Surgeon: Gari Trovato, MD;  Location: [facility] OR;  Service: ENT;  Laterality: Bilateral;  . RADIOACTIVE SEED IMPLANT      No family history on file. Social History:  reports that he quit smoking about 44 years ago. He does not have any smokeless tobacco history on file. He reports that he drinks about 0.6 oz of alcohol per week . He reports that he does not use drugs.  Allergies: No Known Allergies   (Not in a hospital admission)  No results found for this or any previous visit (from the past 48 hour(s)). No results found.  ROS: otherwise negative  There were no vitals taken for this visit.  PHYSICAL EXAM: Overall appearance:  Healthy appearing, in no distress Head:  Normocephalic, atraumatic. Ears: External auditory canals are clear; tympanic membranes are intact and the middle ears are free of any effusion. Nose: External nose is healthy in appearance. Internal nasal exam free of any lesions or obstruction. Oral Cavity/pharynx:  There are no mucosal lesions or masses identified. Hypopharynx/Larynx: Papilloma anterior cords. Vocal cords move normally. Neuro:  No identifiable neurologic deficits. Neck: No palpable neck masses.  Studies Reviewed: none    Assessment/Plan Laryngeal papilloma, repeat laser laryngoscopy.  Kent Davis 02/15/2017, 3:15 PM    

## 2017-02-18 ENCOUNTER — Encounter (HOSPITAL_COMMUNITY): Payer: Self-pay | Admitting: Vascular Surgery

## 2017-02-18 NOTE — Progress Notes (Signed)
Anesthesia Chart Review: SAME DAY WORK-UP.  Patient is a 76 year old male scheduled for micro-laser laryngoscopy with removal of papilloma and biopsy on 02/19/2017 by Dr. Constance Holster. He also underwent microlaryngoscopies at [facility] in 2015 in 2013. He lives in [place], [place].  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   He was seen by Dr. Francee Piccolo at [facility] Specialist at [facility], [place] for pre-operative evaluation on 02/03/17. He felt patient was low risk for surgery and gave permission to hold warfarin prior to surgery and resume it the day after surgery.   Med include vitamin D, Claritin, warfarin (on hold).  EKG 02/03/17 (Dr. Cato Mulligan): SR, moderate voltage criteria for LVH, may be normal variant.   Echo 07/27/13 (Dr. Gregary Signs): Impression: 1. The left ventricle is normal in size and function. 2. Preserved left ventricular systolic function, EF [id number].  3. Normal aortic valve. Moderate Aortic insufficiency. Peak gradient 8.57 mmHg. 4. Normal mitral valve. Mild mitral regurgitation. 5. Tricuspid regurgitation with normal pulmonary pressure.  Spirometry 02/06/17 (Dr. Gregary Signs): FVC 3.76 (78%), FEV1 2.02 (86%). Normal spirometry.   He will get labs on arrival.   Reviewed above with anesthesiologist Dr. Therisa Doyne. He had moderate AI on 2014 echo, but had recent PCP evaluation without CV symptoms. If no acute changes and labs acceptable then it is anticipated that he can proceed as planned.  George Hugh [facility] Short Stay Center/Anesthesiology Phone [phone] 02/18/2017 2:44 PM

## 2017-02-19 ENCOUNTER — Encounter (HOSPITAL_COMMUNITY)
Admission: RE | Disposition: A | Discharge status: Home / Self Care | Payer: Self-pay | Source: Ambulatory Visit | Attending: Otolaryngology

## 2017-02-19 ENCOUNTER — Ambulatory Visit (HOSPITAL_COMMUNITY): Payer: Medicare PPO | Admitting: Vascular Surgery

## 2017-02-19 ENCOUNTER — Ambulatory Visit (HOSPITAL_COMMUNITY)
Admission: RE | Admit: 2017-02-19 | Discharge: 2017-02-19 | Disposition: A | Discharge status: Home / Self Care | Payer: Medicare PPO | Source: Ambulatory Visit | Attending: Otolaryngology | Admitting: Otolaryngology

## 2017-02-19 ENCOUNTER — Encounter (HOSPITAL_COMMUNITY): Payer: Self-pay | Admitting: Anesthesiology

## 2017-02-19 DIAGNOSIS — Z87891 Personal history of nicotine dependence: Secondary | ICD-10-CM | POA: Insufficient documentation

## 2017-02-19 DIAGNOSIS — Z86711 Personal history of pulmonary embolism: Secondary | ICD-10-CM | POA: Insufficient documentation

## 2017-02-19 DIAGNOSIS — Z8546 Personal history of malignant neoplasm of prostate: Secondary | ICD-10-CM | POA: Diagnosis not present

## 2017-02-19 DIAGNOSIS — J302 Other seasonal allergic rhinitis: Secondary | ICD-10-CM | POA: Diagnosis not present

## 2017-02-19 DIAGNOSIS — Z9889 Other specified postprocedural states: Secondary | ICD-10-CM | POA: Insufficient documentation

## 2017-02-19 DIAGNOSIS — D141 Benign neoplasm of larynx: Secondary | ICD-10-CM | POA: Diagnosis not present

## 2017-02-19 HISTORY — DX: Nonrheumatic aortic (valve) insufficiency: I35.1

## 2017-02-19 HISTORY — PX: MICROLARYNGOSCOPY WITH LASER: SHX5972

## 2017-02-19 HISTORY — DX: Personal history of urinary calculi: Z87.442

## 2017-02-19 LAB — PROTIME-INR
INR: 0.97
Prothrombin Time: 12.9 seconds (ref 11.4–15.2)

## 2017-02-19 LAB — CBC
HCT: 44.5 % (ref 39.0–52.0)
HEMOGLOBIN: 14.8 g/dL (ref 13.0–17.0)
MCH: 31 pg (ref 26.0–34.0)
MCHC: 33.3 g/dL (ref 30.0–36.0)
MCV: 93.1 fL (ref 78.0–100.0)
PLATELETS: 167 10*3/uL (ref 150–400)
RBC: 4.78 MIL/uL (ref 4.22–5.81)
RDW: 13.1 % (ref 11.5–15.5)
WBC: 5.3 10*3/uL (ref 4.0–10.5)

## 2017-02-19 SURGERY — MICROLARYNGOSCOPY, WITH PROCEDURE USING LASER
Anesthesia: General | Site: Mouth

## 2017-02-19 MED ORDER — LACTATED RINGERS IV SOLN
INTRAVENOUS | Status: DC
  Administered 2017-02-19 (×2): via INTRAVENOUS

## 2017-02-19 MED ORDER — FENTANYL CITRATE (PF) 100 MCG/2ML IJ SOLN
INTRAMUSCULAR | Status: DC | PRN
  Administered 2017-02-19 (×2): 50 ug via INTRAVENOUS

## 2017-02-19 MED ORDER — SUGAMMADEX SODIUM 200 MG/2ML IV SOLN
INTRAVENOUS | Status: DC | PRN
  Administered 2017-02-19: 200 mg via INTRAVENOUS

## 2017-02-19 MED ORDER — SUCCINYLCHOLINE CHLORIDE 20 MG/ML IJ SOLN
INTRAMUSCULAR | Status: DC | PRN
  Administered 2017-02-19: 100 mg via INTRAVENOUS

## 2017-02-19 MED ORDER — FENTANYL CITRATE (PF) 100 MCG/2ML IJ SOLN
INTRAMUSCULAR | Status: AC
  Filled 2017-02-19: qty 2

## 2017-02-19 MED ORDER — OXYMETAZOLINE HCL 0.05 % NA SOLN
NASAL | Status: DC | PRN
  Administered 2017-02-19: 1 via TOPICAL

## 2017-02-19 MED ORDER — LIDOCAINE-EPINEPHRINE (PF) 1 %-1:200000 IJ SOLN
INTRAMUSCULAR | Status: AC
  Filled 2017-02-19: qty 30

## 2017-02-19 MED ORDER — HYDROMORPHONE HCL 1 MG/ML IJ SOLN: 0.2500 mg | INTRAMUSCULAR | Status: DC | PRN

## 2017-02-19 MED ORDER — PROPOFOL 10 MG/ML IV BOLUS
INTRAVENOUS | Status: DC | PRN
  Administered 2017-02-19: 150 mg via INTRAVENOUS

## 2017-02-19 MED ORDER — EPINEPHRINE HCL (NASAL) 0.1 % NA SOLN
NASAL | Status: AC
  Filled 2017-02-19: qty 30

## 2017-02-19 MED ORDER — 0.9 % SODIUM CHLORIDE (POUR BTL) OPTIME
TOPICAL | Status: DC | PRN
  Administered 2017-02-19: 1000 mL

## 2017-02-19 MED ORDER — ONDANSETRON HCL 4 MG/2ML IJ SOLN
INTRAMUSCULAR | Status: DC | PRN
  Administered 2017-02-19: 4 mg via INTRAVENOUS

## 2017-02-19 MED ORDER — ROCURONIUM BROMIDE 100 MG/10ML IV SOLN
INTRAVENOUS | Status: DC | PRN
  Administered 2017-02-19: 30 mg via INTRAVENOUS

## 2017-02-19 MED ORDER — ARTIFICIAL TEARS OP OINT
TOPICAL_OINTMENT | OPHTHALMIC | Status: DC | PRN
  Administered 2017-02-19: 1 via OPHTHALMIC

## 2017-02-19 MED ORDER — LIDOCAINE HCL (CARDIAC) 20 MG/ML IV SOLN
INTRAVENOUS | Status: DC | PRN
  Administered 2017-02-19: 100 mg via INTRAVENOUS

## 2017-02-19 MED ORDER — PROMETHAZINE HCL 25 MG/ML IJ SOLN: 6.2500 mg | INTRAMUSCULAR | Status: DC | PRN

## 2017-02-19 MED ORDER — LIDOCAINE-EPINEPHRINE 2 %-1:100000 IJ SOLN
INTRAMUSCULAR | Status: AC
  Filled 2017-02-19: qty 1

## 2017-02-19 MED ORDER — PROPOFOL 10 MG/ML IV BOLUS
INTRAVENOUS | Status: AC
  Filled 2017-02-19: qty 20

## 2017-02-19 MED ORDER — DEXAMETHASONE SODIUM PHOSPHATE 10 MG/ML IJ SOLN
INTRAMUSCULAR | Status: DC | PRN
  Administered 2017-02-19: 10 mg via INTRAVENOUS

## 2017-02-19 SURGICAL SUPPLY — 29 items
BALLN PULM 15 16.5 18 X 75CM (BALLOONS)
BALLN PULM 15 16.5 18X75 (BALLOONS)
BALLOON PULM 15 16.5 18X75 (BALLOONS) IMPLANT
BLADE SURG 15 STRL LF DISP TIS (BLADE) IMPLANT
BLADE SURG 15 STRL SS (BLADE)
CANISTER SUCT 3000ML PPV (MISCELLANEOUS) ×4 IMPLANT
CONT SPEC 4OZ CLIKSEAL STRL BL (MISCELLANEOUS) ×4 IMPLANT
COVER BACK TABLE 60X90IN (DRAPES) ×4 IMPLANT
DEPRESSOR TONGUE BLADE STERILE (MISCELLANEOUS) ×4 IMPLANT
DRAPE PROXIMA HALF (DRAPES) IMPLANT
GAUZE SPONGE 4X4 12PLY STRL (GAUZE/BANDAGES/DRESSINGS) IMPLANT
GLOVE BIOGEL PI IND STRL 6.5 (GLOVE) ×2 IMPLANT
GLOVE BIOGEL PI INDICATOR 6.5 (GLOVE) ×2
GLOVE ECLIPSE 7.5 STRL STRAW (GLOVE) ×4 IMPLANT
GLOVE SURG SS PI 6.5 STRL IVOR (GLOVE) ×4 IMPLANT
GUARD TEETH (MISCELLANEOUS) IMPLANT
KIT BASIN OR (CUSTOM PROCEDURE TRAY) ×4 IMPLANT
KIT ROOM TURNOVER OR (KITS) ×4 IMPLANT
NEEDLE PRECISIONGLIDE 27X1.5 (NEEDLE) IMPLANT
NS IRRIG 1000ML POUR BTL (IV SOLUTION) ×4 IMPLANT
PAD ARMBOARD 7.5X6 YLW CONV (MISCELLANEOUS) ×8 IMPLANT
PATTIES SURGICAL .5 X3 (DISPOSABLE) ×4 IMPLANT
SPECIMEN JAR SMALL (MISCELLANEOUS) ×4 IMPLANT
SYR CONTROL 10ML LL (SYRINGE) IMPLANT
SYR TB 1ML LUER SLIP (SYRINGE) IMPLANT
TOWEL OR 17X24 6PK STRL BLUE (TOWEL DISPOSABLE) ×4 IMPLANT
TUBE CONNECTING 12'X1/4 (SUCTIONS) ×1
TUBE CONNECTING 12X1/4 (SUCTIONS) ×3 IMPLANT
WATER STERILE IRR 1000ML POUR (IV SOLUTION) IMPLANT

## 2017-02-19 NOTE — H&P (View-Only) (Signed)
  Kent Davis is an 76 y.o. male.   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 Getting very hoarse again, no trouble breathing.  Past Medical History:  Diagnosis Date  . 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 Hx pulmonary embolism 2007   x2-once long plane flight to [place],other playing tennis  . Seasonal allergies     Past Surgical History:  Procedure Laterality Date  . COLONOSCOPY    . LARYNGOSCOPY  9/11 and 2013   vocal cord polyp  . MICROLARYNGOSCOPY Bilateral 12/06/2014   Procedure: MICROLARYNGOSCOPY WITH REMOVAL OF PAPILLOMA;  Surgeon: Izora Gala, MD;  Location: [facility] [place];  Service: ENT;  Laterality: Bilateral;  . RADIOACTIVE SEED IMPLANT      No family history on file. Social History:  reports that he quit smoking about 44 years ago. He does not have any smokeless tobacco history on file. He reports that he drinks about 0.6 oz of alcohol per week . He reports that he does not use drugs.  Allergies: No Known Allergies   (Not in a hospital admission)  No results found for this or any previous visit (from the past 48 hour(s)). No results found.  ROS: otherwise negative  There were no vitals taken for this visit.  PHYSICAL EXAM: Overall appearance:  Healthy appearing, in no distress Head:  Normocephalic, atraumatic. Ears: External auditory canals are clear; tympanic membranes are intact and the middle ears are free of any effusion. Nose: External nose is healthy in appearance. Internal nasal exam free of any lesions or obstruction. Oral Cavity/pharynx:  There are no mucosal lesions or masses identified. Hypopharynx/Larynx: Papilloma anterior cords. Vocal cords move normally. Neuro:  No identifiable neurologic deficits. Neck: No palpable neck masses.  Studies Reviewed: none    Assessment/Plan Laryngeal papilloma, repeat laser laryngoscopy.  Mollye Guinta 02/15/2017, 3:15 PM

## 2017-02-19 NOTE — Anesthesia Postprocedure Evaluation (Addendum)
Anesthesia Post Note  Patient: Kent Davis  Procedure(s) Performed: Procedure(s) (LRB): MICROLARYNGOSCOPY WITH LASER WITH REMOVAL OF PAPILLOMA AND BIOPSY (N/A)  Patient location during evaluation: PACU Anesthesia Type: General Level of consciousness: sedated Pain management: pain level controlled Vital Signs Assessment: post-procedure vital signs reviewed and stable Respiratory status: spontaneous breathing and respiratory function stable Cardiovascular status: stable Anesthetic complications: no       Last Vitals:  Vitals:   02/19/17 1200 02/19/17 1215  BP: 128/84 123/81  Pulse: 74 75  Resp: 11   Temp:      Last Pain:  Vitals:   02/19/17 0849  TempSrc: Oral                 Pamella Samons DANIEL

## 2017-02-19 NOTE — Interval H&P Note (Signed)
History and Physical Interval Note:  02/19/2017 10:18 AM  Kent Davis  has presented today for surgery, with the diagnosis of Laryngeal papillomatosis  The various methods of treatment have been discussed with the patient and family. After consideration of risks, benefits and other options for treatment, the patient has consented to  Procedure(s) with comments: LARYNGOSCOPY (N/A) - Micro-laser Laryngoscopy with removal of papilloma and biopsy as a surgical intervention .  The patient's history has been reviewed, patient examined, no change in status, stable for surgery.  I have reviewed the patient's chart and labs.  Questions were answered to the patient's satisfaction.     [name]

## 2017-02-19 NOTE — Anesthesia Preprocedure Evaluation (Signed)
Anesthesia Evaluation  Patient identified by MRN, date of birth, ID band Patient awake    Reviewed: Allergy & Precautions, NPO status , Patient's Chart, lab work & pertinent test results  History of Anesthesia Complications Negative for: history of anesthetic complications  Airway Mallampati: II  TM Distance: >3 FB Neck ROM: Full    Dental no notable dental hx. (+) Dental Advisory Given   Pulmonary former smoker,    Pulmonary exam normal        Cardiovascular Normal cardiovascular exam+ Valvular Problems/Murmurs AI      Neuro/Psych negative neurological ROS  negative psych ROS   GI/Hepatic negative GI ROS, Neg liver ROS,   Endo/Other  negative endocrine ROS  Renal/GU negative Renal ROS     Musculoskeletal   Abdominal   Peds  Hematology   Anesthesia Other Findings   Reproductive/Obstetrics                             Anesthesia Physical Anesthesia Plan  ASA: III  Anesthesia Plan: General   Post-op Pain Management:    Induction: Intravenous  Airway Management Planned: Oral ETT  Additional Equipment:   Intra-op Plan:   Post-operative Plan: Extubation in OR  Informed Consent: I have reviewed the patients History and Physical, chart, labs and discussed the procedure including the risks, benefits and alternatives for the proposed anesthesia with the patient or authorized representative who has indicated his/her understanding and acceptance.   Dental advisory given  Plan Discussed with: CRNA, Anesthesiologist and Surgeon  Anesthesia Plan Comments:         Anesthesia Quick Evaluation                                   Anesthesia Evaluation  Patient identified by MRN, date of birth, ID band Patient awake    Reviewed: Allergy & Precautions, H&P , NPO status , Patient's Chart, lab work & pertinent test results  Airway Mallampati: II  TM Distance: >3 FB Neck ROM:  Full    Dental  (+) Teeth Intact, Dental Advisory Given   Pulmonary former smoker,  breath sounds clear to auscultation        Cardiovascular Rhythm:Regular Rate:Normal     Neuro/Psych    GI/Hepatic   Endo/Other    Renal/GU      Musculoskeletal   Abdominal   Peds  Hematology   Anesthesia Other Findings   Reproductive/Obstetrics                            Anesthesia Physical Anesthesia Plan  ASA: III  Anesthesia Plan: General   Post-op Pain Management:    Induction: Intravenous  Airway Management Planned: Oral ETT  Additional Equipment:   Intra-op Plan:   Post-operative Plan: Extubation in OR  Informed Consent: I have reviewed the patients History and Physical, chart, labs and discussed the procedure including the risks, benefits and alternatives for the proposed anesthesia with the patient or authorized representative who has indicated his/her understanding and acceptance.   Dental advisory given  Plan Discussed with: CRNA and Anesthesiologist  Anesthesia Plan Comments:         Anesthesia Quick Evaluation

## 2017-02-19 NOTE — Op Note (Signed)
OPERATIVE REPORT  DATE OF SURGERY: 02/19/2017  PATIENT:  Loletta Parish,  76 y.o. male  PRE-OPERATIVE DIAGNOSIS:  Laryngeal papillomatosis  POST-OPERATIVE DIAGNOSIS:  Laryngeal papillomatosis  PROCEDURE:  Procedure(s): MICROLARYNGOSCOPY WITH LASER WITH REMOVAL OF PAPILLOMA AND BIOPSY  SURGEON:  Beckie Salts, MD  ASSISTANTS: None  ANESTHESIA:   General   EBL:  Minimal ml  DRAINS: None  LOCAL MEDICATIONS USED:  None  SPECIMEN:  Laryngeal mass  COUNTS:  Correct  PROCEDURE DETAILS: The patient was taken to the operating room and placed on the operating table in the supine position. Following induction of general endotracheal anesthesia, the table was turned 90 and the patient was draped in a standard fashion. Saline soaked eye pads and facial towels were used for laser protection. A maxillary tooth protector was used. A laser safe laryngoscope was entered into the oral cavity, used to evaluate the larynx and attached the [place] stand with the suspension apparatus. The operating microscope was then brought into the field. The larynx was inspected. There is a large bulky papillomatous mass involving the anterior aspect of both cords, with extension into the subglottis, especially on the left, and anterior commissure involvement. The remainder of the larynx was normal. Large fragments were taken with biopsy forceps for pathologic analysis. The CO2 laser was then used a setting of 3 W continuous power to obliterate the remainder of the papillomatous mass. Extensive laser was used on both sides. Tiny amounts were left on opposing aspects of the anterior cord to prevent adhesion postoperatively. Topical adrenaline was used on pledgets for hemostasis. The scope and the by protector were then removed. Patient was awakened extubated and transferred to recovery in stable condition.    PATIENT DISPOSITION:  To PACU, stable

## 2017-02-19 NOTE — Transfer of Care (Signed)
Immediate Anesthesia Transfer of Care Note  Patient: Kent Davis  Procedure(s) Performed: Procedure(s): MICROLARYNGOSCOPY WITH LASER WITH REMOVAL OF PAPILLOMA AND BIOPSY (N/A)  Patient Location: PACU  Anesthesia Type:General  Level of Consciousness: awake, alert , oriented and patient cooperative  Airway & Oxygen Therapy: Patient Spontanous Breathing and Patient connected to nasal cannula oxygen  Post-op Assessment: Report given to RN and Post -op Vital signs reviewed and stable  Post vital signs: Reviewed and stable  Last Vitals:  Vitals:   02/19/17 0849 02/19/17 1131  BP: (!) 153/88   Pulse: 66   Resp: 18   Temp: 36.7 C (P) 36.4 C    Last Pain:  Vitals:   02/19/17 0849  TempSrc: Oral      Patients Stated Pain Goal: 4 (72/82/06 0156)  Complications: No apparent anesthesia complications

## 2017-02-20 ENCOUNTER — Encounter (HOSPITAL_COMMUNITY): Payer: Self-pay | Admitting: Otolaryngology

## 2017-02-27 ENCOUNTER — Other Ambulatory Visit (HOSPITAL_COMMUNITY): Payer: Self-pay | Admitting: Otolaryngology

## 2017-02-27 DIAGNOSIS — J387 Other diseases of larynx: Secondary | ICD-10-CM

## 2017-03-05 ENCOUNTER — Ambulatory Visit: Payer: Self-pay | Admitting: Otolaryngology

## 2017-03-05 NOTE — H&P (Signed)
Kent Davis is an 76 y.o. male.   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  Past Medical History:  Diagnosis Date  . Aortic regurgitation    moderate AR by 07/27/13 echo (Dr. Gregary Signs, [place] [facility], [place], [place])  . 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 History of kidney stones   . Hx pulmonary embolism 2007   x2-once long plane flight to [place],other playing tennis  . Seasonal allergies     Past Surgical History:  Procedure Laterality Date  . COLONOSCOPY    . LARYNGOSCOPY  9/11 and 2013   vocal cord polyp  . MICROLARYNGOSCOPY Bilateral 12/06/2014   Procedure: MICROLARYNGOSCOPY WITH REMOVAL OF PAPILLOMA;  Surgeon: Izora Gala, MD;  Location: [place];  Service: ENT;  Laterality: Bilateral;  . MICROLARYNGOSCOPY WITH LASER N/A 02/19/2017   Procedure: MICROLARYNGOSCOPY WITH LASER WITH REMOVAL OF PAPILLOMA AND BIOPSY;  Surgeon: Izora Gala, MD;  Location: [place];  Service: ENT;  Laterality: N/A;  . RADIOACTIVE SEED IMPLANT    . 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    No family history on file. Social History:  reports that he quit smoking about 44 years ago. He quit after 3.00 years of use. He has never used smokeless tobacco. He reports that he drinks about 1.8 oz of alcohol per week . He reports that he does not use drugs.  Allergies:  Allergies  Allergen Reactions  . No Known Allergies      (Not in a hospital admission)  No results found for this or any previous visit (from the past 48 hour(s)). No results found.  ROS: otherwise negative  There were no vitals taken for this visit.  PHYSICAL EXAM: Overall appearance:  Healthy appearing, in no distress Head:  Normocephalic, atraumatic. Ears: External auditory canals are clear; tympanic membranes are intact and the  middle ears are free of any effusion. Nose: External nose is healthy in appearance. Internal nasal exam free of any lesions or obstruction. Oral Cavity/pharynx:  There are no mucosal lesions or masses identified. Hypopharynx/Larynx: Upper limits is lesion involving the anterior cords and the anterior commissure. Vocal cords move normally. Neuro:  No identifiable neurologic deficits. Neck: No palpable neck masses.  Studies Reviewed: none    Assessment/Plan Recommend proceed with microlaryngoscopy and biopsy.  Mollee Neer 03/05/2017, 12:24 PM

## 2017-03-25 ENCOUNTER — Encounter (HOSPITAL_COMMUNITY): Payer: Self-pay | Admitting: *Deleted

## 2017-03-25 NOTE — Progress Notes (Signed)
Pt SDW-Pre-op call completed by pt spouse, Stanton Kidney, with pt verbal consent. Spouse denies that pt C/O SOB and chest pain. Spouse denies that pt is under the care of a cardiologist. Spouse denies that pt had a cardiac cath but stated that  a stress test was performed by PCP, Dr. Francee Piccolo, as well as a chest x ray ( within the last year) ; records requested. Spouse made aware to have pt stop taking Aspirin, vitamins, fish oil and herbal medications. Do not take any NSAIDs ie: Ibuprofen, Advil, Naproxen, BC and Goody Powder or any medication containing Aspirin.Spouse stated that pt last dose of Coumadin was " last Friday." Spouse verbalized understanding of all pre-op instructions.

## 2017-03-28 ENCOUNTER — Encounter (HOSPITAL_COMMUNITY): Payer: Self-pay

## 2017-03-28 ENCOUNTER — Ambulatory Visit (HOSPITAL_COMMUNITY)
Admission: RE | Admit: 2017-03-28 | Discharge: 2017-03-28 | Disposition: A | Discharge status: Home / Self Care | Payer: Medicare PPO | Source: Ambulatory Visit | Attending: Otolaryngology | Admitting: Otolaryngology

## 2017-03-28 ENCOUNTER — Ambulatory Visit (HOSPITAL_COMMUNITY): Payer: Medicare PPO | Admitting: Certified Registered Nurse Anesthetist

## 2017-03-28 ENCOUNTER — Encounter (HOSPITAL_COMMUNITY): Payer: Self-pay | Admitting: *Deleted

## 2017-03-28 ENCOUNTER — Encounter (HOSPITAL_COMMUNITY)
Admission: RE | Disposition: A | Discharge status: Home / Self Care | Payer: Self-pay | Source: Ambulatory Visit | Attending: Otolaryngology

## 2017-03-28 DIAGNOSIS — J381 Polyp of vocal cord and larynx: Secondary | ICD-10-CM | POA: Diagnosis not present

## 2017-03-28 DIAGNOSIS — Z86711 Personal history of pulmonary embolism: Secondary | ICD-10-CM | POA: Insufficient documentation

## 2017-03-28 DIAGNOSIS — Z87442 Personal history of urinary calculi: Secondary | ICD-10-CM | POA: Diagnosis not present

## 2017-03-28 DIAGNOSIS — J387 Other diseases of larynx: Secondary | ICD-10-CM | POA: Diagnosis present

## 2017-03-28 DIAGNOSIS — D020 Carcinoma in situ of larynx: Secondary | ICD-10-CM | POA: Diagnosis not present

## 2017-03-28 DIAGNOSIS — I351 Nonrheumatic aortic (valve) insufficiency: Secondary | ICD-10-CM | POA: Diagnosis not present

## 2017-03-28 DIAGNOSIS — Z7901 Long term (current) use of anticoagulants: Secondary | ICD-10-CM | POA: Diagnosis not present

## 2017-03-28 DIAGNOSIS — Z9889 Other specified postprocedural states: Secondary | ICD-10-CM | POA: Diagnosis not present

## 2017-03-28 DIAGNOSIS — E041 Nontoxic single thyroid nodule: Secondary | ICD-10-CM | POA: Insufficient documentation

## 2017-03-28 DIAGNOSIS — J302 Other seasonal allergic rhinitis: Secondary | ICD-10-CM | POA: Diagnosis not present

## 2017-03-28 DIAGNOSIS — Z79899 Other long term (current) drug therapy: Secondary | ICD-10-CM | POA: Insufficient documentation

## 2017-03-28 DIAGNOSIS — Z87891 Personal history of nicotine dependence: Secondary | ICD-10-CM | POA: Insufficient documentation

## 2017-03-28 DIAGNOSIS — Z8546 Personal history of malignant neoplasm of prostate: Secondary | ICD-10-CM | POA: Insufficient documentation

## 2017-03-28 DIAGNOSIS — Z923 Personal history of irradiation: Secondary | ICD-10-CM | POA: Diagnosis not present

## 2017-03-28 HISTORY — PX: MICROLARYNGOSCOPY: SHX5208

## 2017-03-28 HISTORY — DX: Other diseases of larynx: J38.7

## 2017-03-28 LAB — BASIC METABOLIC PANEL
Anion gap: 6 (ref 5–15)
BUN: 25 mg/dL — AB (ref 6–20)
CHLORIDE: 110 mmol/L (ref 101–111)
CO2: 22 mmol/L (ref 22–32)
CREATININE: 1.26 mg/dL — AB (ref 0.61–1.24)
Calcium: 8.5 mg/dL — ABNORMAL LOW (ref 8.9–10.3)
GFR, EST NON AFRICAN AMERICAN: 54 mL/min — AB (ref >60)
Glucose, Bld: 108 mg/dL — ABNORMAL HIGH (ref 65–99)
POTASSIUM: 5.2 mmol/L — AB (ref 3.5–5.1)
SODIUM: 138 mmol/L (ref 135–145)

## 2017-03-28 LAB — CBC
HCT: 45.5 % (ref 39.0–52.0)
Hemoglobin: 15.3 g/dL (ref 13.0–17.0)
MCH: 31.2 pg (ref 26.0–34.0)
MCHC: 33.6 g/dL (ref 30.0–36.0)
MCV: 92.7 fL (ref 78.0–100.0)
PLATELETS: 141 10*3/uL — AB (ref 150–400)
RBC: 4.91 MIL/uL (ref 4.22–5.81)
RDW: 13.4 % (ref 11.5–15.5)
WBC: 4.8 10*3/uL (ref 4.0–10.5)

## 2017-03-28 LAB — PROTIME-INR
INR: 1.01
PROTHROMBIN TIME: 13.3 s (ref 11.4–15.2)

## 2017-03-28 SURGERY — MICROLARYNGOSCOPY
Anesthesia: General | Site: Mouth

## 2017-03-28 MED ORDER — ROCURONIUM BROMIDE 10 MG/ML (PF) SYRINGE
PREFILLED_SYRINGE | INTRAVENOUS | Status: DC | PRN
  Administered 2017-03-28: 50 mg via INTRAVENOUS

## 2017-03-28 MED ORDER — SUGAMMADEX SODIUM 200 MG/2ML IV SOLN
INTRAVENOUS | Status: AC
  Filled 2017-03-28: qty 2

## 2017-03-28 MED ORDER — OXYCODONE HCL 5 MG PO TABS: 5.0000 mg | ORAL_TABLET | Freq: Once | ORAL | Status: DC | PRN

## 2017-03-28 MED ORDER — IOPAMIDOL (ISOVUE-300) INJECTION 61%
INTRAVENOUS | Status: AC
  Administered 2017-03-28: 75 mL
  Filled 2017-03-28: qty 75

## 2017-03-28 MED ORDER — PROPOFOL 10 MG/ML IV BOLUS
INTRAVENOUS | Status: AC
  Filled 2017-03-28: qty 20

## 2017-03-28 MED ORDER — ONDANSETRON HCL 4 MG/2ML IJ SOLN
INTRAMUSCULAR | Status: DC | PRN
Start: 2017-03-28 — End: 2017-03-28
  Administered 2017-03-28: 4 mg via INTRAVENOUS

## 2017-03-28 MED ORDER — TRIAMCINOLONE ACETONIDE 40 MG/ML IJ SUSP
INTRAMUSCULAR | Status: AC
  Filled 2017-03-28: qty 5

## 2017-03-28 MED ORDER — LIDOCAINE 2% (20 MG/ML) 5 ML SYRINGE
INTRAMUSCULAR | Status: DC | PRN
  Administered 2017-03-28: 40 mg via INTRAVENOUS

## 2017-03-28 MED ORDER — FENTANYL CITRATE (PF) 100 MCG/2ML IJ SOLN: 25.0000 ug | INTRAMUSCULAR | Status: DC | PRN

## 2017-03-28 MED ORDER — FENTANYL CITRATE (PF) 100 MCG/2ML IJ SOLN
INTRAMUSCULAR | Status: DC | PRN
  Administered 2017-03-28: 100 ug via INTRAVENOUS

## 2017-03-28 MED ORDER — SUGAMMADEX SODIUM 200 MG/2ML IV SOLN
INTRAVENOUS | Status: DC | PRN
  Administered 2017-03-28: 200 mg via INTRAVENOUS

## 2017-03-28 MED ORDER — LACTATED RINGERS IV SOLN
INTRAVENOUS | Status: DC
  Administered 2017-03-28: 10:00:00 via INTRAVENOUS

## 2017-03-28 MED ORDER — EPINEPHRINE HCL (NASAL) 0.1 % NA SOLN
NASAL | Status: DC | PRN
  Administered 2017-03-28: 30 mL via TOPICAL

## 2017-03-28 MED ORDER — EPINEPHRINE HCL (NASAL) 0.1 % NA SOLN
NASAL | Status: AC
  Filled 2017-03-28: qty 30

## 2017-03-28 MED ORDER — 0.9 % SODIUM CHLORIDE (POUR BTL) OPTIME
TOPICAL | Status: DC | PRN
  Administered 2017-03-28: 1000 mL

## 2017-03-28 MED ORDER — DEXAMETHASONE SODIUM PHOSPHATE 10 MG/ML IJ SOLN
INTRAMUSCULAR | Status: AC
  Filled 2017-03-28: qty 1

## 2017-03-28 MED ORDER — ONDANSETRON HCL 4 MG/2ML IJ SOLN
INTRAMUSCULAR | Status: AC
  Filled 2017-03-28: qty 2

## 2017-03-28 MED ORDER — FENTANYL CITRATE (PF) 250 MCG/5ML IJ SOLN
INTRAMUSCULAR | Status: AC
  Filled 2017-03-28: qty 5

## 2017-03-28 MED ORDER — OXYCODONE HCL 5 MG/5ML PO SOLN: 5.0000 mg | Freq: Once | ORAL | Status: DC | PRN

## 2017-03-28 MED ORDER — LACTATED RINGERS IV SOLN: INTRAVENOUS | Status: DC

## 2017-03-28 MED ORDER — PROPOFOL 10 MG/ML IV BOLUS
INTRAVENOUS | Status: DC | PRN
  Administered 2017-03-28: 150 mg via INTRAVENOUS

## 2017-03-28 MED ORDER — DEXAMETHASONE SODIUM PHOSPHATE 10 MG/ML IJ SOLN
INTRAMUSCULAR | Status: DC | PRN
  Administered 2017-03-28: 4 mg via INTRAVENOUS

## 2017-03-28 MED ORDER — PHENYLEPHRINE 40 MCG/ML (10ML) SYRINGE FOR IV PUSH (FOR BLOOD PRESSURE SUPPORT)
PREFILLED_SYRINGE | INTRAVENOUS | Status: AC
  Filled 2017-03-28: qty 10

## 2017-03-28 MED ORDER — LIDOCAINE-EPINEPHRINE 1 %-1:100000 IJ SOLN
INTRAMUSCULAR | Status: AC
  Filled 2017-03-28: qty 1

## 2017-03-28 MED ORDER — ONDANSETRON HCL 4 MG/2ML IJ SOLN: 4.0000 mg | Freq: Once | INTRAMUSCULAR | Status: DC | PRN

## 2017-03-28 SURGICAL SUPPLY — 29 items
BANDAGE EYE OVAL (MISCELLANEOUS) ×3 IMPLANT
BLADE SURG 15 STRL LF DISP TIS (BLADE) IMPLANT
BLADE SURG 15 STRL SS (BLADE)
CANISTER SUCT 3000ML PPV (MISCELLANEOUS) ×3 IMPLANT
CONT SPEC 4OZ CLIKSEAL STRL BL (MISCELLANEOUS) ×3 IMPLANT
COVER BACK TABLE 60X90IN (DRAPES) ×3 IMPLANT
COVER MAYO STAND STRL (DRAPES) ×3 IMPLANT
DRAPE HALF SHEET 40X57 (DRAPES) ×3 IMPLANT
DRESSING TELFA 8X10 (GAUZE/BANDAGES/DRESSINGS) ×3 IMPLANT
ELECT REM PT RETURN 9FT ADLT (ELECTROSURGICAL)
ELECTRODE REM PT RTRN 9FT ADLT (ELECTROSURGICAL) IMPLANT
GAUZE SPONGE 4X4 16PLY XRAY LF (GAUZE/BANDAGES/DRESSINGS) ×3 IMPLANT
GLOVE ECLIPSE 7.5 STRL STRAW (GLOVE) ×3 IMPLANT
GUARD TEETH (MISCELLANEOUS) ×3 IMPLANT
KIT BASIN OR (CUSTOM PROCEDURE TRAY) ×3 IMPLANT
KIT ROOM TURNOVER OR (KITS) ×3 IMPLANT
NEEDLE PRECISIONGLIDE 27X1.5 (NEEDLE) IMPLANT
NS IRRIG 1000ML POUR BTL (IV SOLUTION) ×3 IMPLANT
PAD ARMBOARD 7.5X6 YLW CONV (MISCELLANEOUS) ×6 IMPLANT
PATTIES SURGICAL .5 X1 (DISPOSABLE) IMPLANT
SOLUTION ANTI FOG 6CC (MISCELLANEOUS) ×3 IMPLANT
SPECIMEN JAR SMALL (MISCELLANEOUS) ×3 IMPLANT
SYR 3ML LL SCALE MARK (SYRINGE) IMPLANT
SYR CONTROL 10ML LL (SYRINGE) IMPLANT
SYR TB 1ML LUER SLIP (SYRINGE) IMPLANT
TOWEL OR 17X24 6PK STRL BLUE (TOWEL DISPOSABLE) ×6 IMPLANT
TUBE CONNECTING 12'X1/4 (SUCTIONS) ×1
TUBE CONNECTING 12X1/4 (SUCTIONS) ×2 IMPLANT
WATER STERILE IRR 1000ML POUR (IV SOLUTION) ×3 IMPLANT

## 2017-03-28 NOTE — Transfer of Care (Signed)
Immediate Anesthesia Transfer of Care Note  Patient: Kent Davis  Procedure(s) Performed: Procedure(s) with comments: MICROLARYNGOSCOPY (N/A) - with biopsy  Patient Location: PACU  Anesthesia Type:General  Level of Consciousness: awake, patient cooperative and responds to stimulation  Airway & Oxygen Therapy: Patient Spontanous Breathing and Patient connected to nasal cannula oxygen  Post-op Assessment: Report given to RN and Post -op Vital signs reviewed and stable  Post vital signs: Reviewed and stable  Last Vitals: There were no vitals filed for this visit.  Last Pain: There were no vitals filed for this visit.       Complications: No apparent anesthesia complications

## 2017-03-28 NOTE — Anesthesia Preprocedure Evaluation (Addendum)
Anesthesia Evaluation  Patient identified by MRN, date of birth, ID band Patient awake    Reviewed: Allergy & Precautions, NPO status , Patient's Chart, lab work & pertinent test results  Airway Mallampati: II  TM Distance: >3 FB Neck ROM: Full    Dental  (+) Teeth Intact, Dental Advisory Given   Pulmonary former smoker,    breath sounds clear to auscultation       Cardiovascular  Rhythm:Regular Rate:Normal     Neuro/Psych    GI/Hepatic   Endo/Other    Renal/GU      Musculoskeletal   Abdominal   Peds  Hematology   Anesthesia Other Findings   Reproductive/Obstetrics                            Anesthesia Physical Anesthesia Plan  ASA: III  Anesthesia Plan: General   Post-op Pain Management:    Induction: Intravenous  Airway Management Planned: Oral ETT  Additional Equipment:   Intra-op Plan:   Post-operative Plan: Extubation in OR  Informed Consent: I have reviewed the patients History and Physical, chart, labs and discussed the procedure including the risks, benefits and alternatives for the proposed anesthesia with the patient or authorized representative who has indicated his/her understanding and acceptance.   Dental advisory given  Plan Discussed with: CRNA and Anesthesiologist  Anesthesia Plan Comments:        Anesthesia Quick Evaluation  

## 2017-03-28 NOTE — Anesthesia Procedure Notes (Signed)
Procedure Name: Intubation Date/Time: 03/28/2017 10:22 AM Performed by: Judeth Cornfield T Pre-anesthesia Checklist: Patient identified, Emergency Drugs available, Suction available and Patient being monitored Patient Re-evaluated:Patient Re-evaluated prior to inductionOxygen Delivery Method: Circle System Utilized Preoxygenation: Pre-oxygenation with 100% oxygen Intubation Type: Combination inhalational/ intravenous induction Ventilation: Mask ventilation without difficulty and Oral airway inserted - appropriate to patient size Laryngoscope Size: Miller and 3 Grade View: Grade I Tube type: Oral Tube size: 6.0 mm Number of attempts: 1 Airway Equipment and Method: Stylet and Oral airway Placement Confirmation: ETT inserted through vocal cords under direct vision,  positive ETCO2 and breath sounds checked- equal and bilateral Secured at: 22 cm Tube secured with: Tape Dental Injury: Teeth and Oropharynx as per pre-operative assessment

## 2017-03-28 NOTE — Anesthesia Postprocedure Evaluation (Addendum)
Anesthesia Post Note  Patient: Kent Davis  Procedure(s) Performed: Procedure(s) (LRB): MICROLARYNGOSCOPY (N/A)  Patient location during evaluation: PACU Anesthesia Type: General Level of consciousness: awake, awake and alert and oriented Pain management: pain level controlled Vital Signs Assessment: post-procedure vital signs reviewed and stable Respiratory status: spontaneous breathing, nonlabored ventilation and respiratory function stable Cardiovascular status: blood pressure returned to baseline Anesthetic complications: no       Last Vitals:  Vitals:   03/28/17 1054 03/28/17 1055  BP: 117/68   Pulse: 78   Resp: 10   Temp:  36.4 C    Last Pain: There were no vitals filed for this visit.               Henrique Parekh COKER

## 2017-03-28 NOTE — Interval H&P Note (Signed)
History and Physical Interval Note:  03/28/2017 10:05 AM  Kent Davis  has presented today for surgery, with the diagnosis of Laryngeal Mass  The various methods of treatment have been discussed with the patient and family. After consideration of risks, benefits and other options for treatment, the patient has consented to  Procedure(s) with comments: MICROLARYNGOSCOPY (N/A) - with biopsy as a surgical intervention .  The patient's history has been reviewed, patient examined, no change in status, stable for surgery.  I have reviewed the patient's chart and labs.  Questions were answered to the patient's satisfaction.     [name]

## 2017-03-28 NOTE — Discharge Instructions (Signed)
Resume Coumadin on Sunday.

## 2017-03-28 NOTE — H&P (View-Only) (Signed)
Kent Davis is an 76 y.o. male.   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  Past Medical History:  Diagnosis Date  . Aortic regurgitation    moderate AR by 07/27/13 echo (Dr. Gregary Signs, [place] [facility], [place], [place])  . 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 History of kidney stones   . Hx pulmonary embolism 2007   x2-once long plane flight to [place],other playing tennis  . Seasonal allergies     Past Surgical History:  Procedure Laterality Date  . COLONOSCOPY    . LARYNGOSCOPY  9/11 and 2013   vocal cord polyp  . MICROLARYNGOSCOPY Bilateral 12/06/2014   Procedure: MICROLARYNGOSCOPY WITH REMOVAL OF PAPILLOMA;  Surgeon: Izora Gala, MD;  Location: [place];  Service: ENT;  Laterality: Bilateral;  . MICROLARYNGOSCOPY WITH LASER N/A 02/19/2017   Procedure: MICROLARYNGOSCOPY WITH LASER WITH REMOVAL OF PAPILLOMA AND BIOPSY;  Surgeon: Izora Gala, MD;  Location: [place];  Service: ENT;  Laterality: N/A;  . RADIOACTIVE SEED IMPLANT    . 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    No family history on file. Social History:  reports that he quit smoking about 44 years ago. He quit after 3.00 years of use. He has never used smokeless tobacco. He reports that he drinks about 1.8 oz of alcohol per week . He reports that he does not use drugs.  Allergies:  Allergies  Allergen Reactions  . No Known Allergies      (Not in a hospital admission)  No results found for this or any previous visit (from the past 48 hour(s)). No results found.  ROS: otherwise negative  There were no vitals taken for this visit.  PHYSICAL EXAM: Overall appearance:  Healthy appearing, in no distress Head:  Normocephalic, atraumatic. Ears: External auditory canals are clear; tympanic membranes are intact and the  middle ears are free of any effusion. Nose: External nose is healthy in appearance. Internal nasal exam free of any lesions or obstruction. Oral Cavity/pharynx:  There are no mucosal lesions or masses identified. Hypopharynx/Larynx: Upper limits is lesion involving the anterior cords and the anterior commissure. Vocal cords move normally. Neuro:  No identifiable neurologic deficits. Neck: No palpable neck masses.  Studies Reviewed: none    Assessment/Plan Recommend proceed with microlaryngoscopy and biopsy.  Kent Davis 03/05/2017, 12:24 PM

## 2017-03-28 NOTE — Op Note (Signed)
OPERATIVE REPORT  DATE OF SURGERY: 03/28/2017  PATIENT:  Kent Davis,  76 y.o. male  PRE-OPERATIVE DIAGNOSIS:  Laryngeal Mass  POST-OPERATIVE DIAGNOSIS:  Laryngeal Mass  PROCEDURE:  Procedure(s): MICROLARYNGOSCOPY  SURGEON:  Beckie Salts, MD  ASSISTANTS: None  ANESTHESIA:   General   EBL:  10 ml  DRAINS: None  LOCAL MEDICATIONS USED:  None  SPECIMEN:  Laryngeal mass  COUNTS:  Correct  PROCEDURE DETAILS: The patient was taken to the operating room and placed on the operating table in the supine position. Following induction of general endotracheal anesthesia, the patient was draped in a standard fashion. The table was turned 90. A maxillary tooth protector was used. A Jako laryngoscope was entered into the oral cavity used to evaluate the larynx. This was attached to the [place] stand using the suspension apparatus. Microscope was brought into view. It was inspected. There was regrowth of papillomas along the anterior cords bilaterally in the anterior commissure. This was all removed using biopsy forceps. Care was taken to take deeper biopsies in order for thorough pathologic evaluation. Topical adrenaline on pledgets was used for hemostasis. There is no other abnormal findings. The scope was removed and the patient was awakened extubated and transferred to recovery in stable condition.    PATIENT DISPOSITION:  To PACU, stable

## 2017-03-29 ENCOUNTER — Encounter (HOSPITAL_COMMUNITY): Payer: Self-pay | Admitting: Otolaryngology

## 2017-05-17 NOTE — Addendum Note (Signed)
Addendum  created 05/17/17 1058 by Duane Boston, MD   Sign clinical note

## 2017-08-07 NOTE — Addendum Note (Signed)
Addendum  created 08/07/17 1259 by Roberts Gaudy, MD   Sign clinical note

## 2018-03-24 NOTE — H&P (Signed)
Return visit coming well, no complaints.  Voice is a little bit hoarse today  Procedure note: Flexible fiberoptic laryngoscopy  Details of the procedure were explained to the patient and all questions were answered.   Procedure:   After anesthetizing the nasal cavity with topical lidocaine and oxymetazoline, the flexible endoscope was introduced and passed through the nasal cavity into the nasopharynx. The scope was then advanced to the level of the oropharynx, then the hypopharynx and larynx.  Findings:   The posterior soft palate, uvula, tongue base and vallecula were visualized and appeared healthy without mucosal masses or lesions. The epiglottis, aryepiglottic folds, hypopharynx, supraglottis, glottis were visualized and appeared healthy without mucosal masses or lesions except for a small papillomatous growth along the right side of the anterior commissure on the superior aspect of the vocal folds. Vocal fold mobility was intact and symmetric.   Additional findings: None  The scope was withdrawn from the nose. He tolerated the procedure well.  No palpable adenopathy.  History of papilloma, history of carcinoma in situ. Recommend repeat excisional biopsy of this lesion. No definite evidence of invasion clinically but this needs to be followed closely. He wants to [facility] after a vacation in March its reasonable but I would not wait much longer than that. He will call to schedule when he is ready.

## 2018-04-08 ENCOUNTER — Encounter (HOSPITAL_COMMUNITY): Payer: Self-pay | Admitting: *Deleted

## 2018-04-08 ENCOUNTER — Other Ambulatory Visit: Payer: Self-pay

## 2018-04-08 NOTE — Progress Notes (Signed)
Pt denies SOB, chest pain, and being under the care of a cardiologist. Pt denies having a cardiac cath.  Pt denies having a chest x ray and EKG within the last year. Pt denies recent labs. Pt stated that last dose of Coumadin was Sunday as instructed by surgeon. Pt made aware to stop taking vitamins, fish oil and herbal medications. Do not take any NSAIDs ie: Ibuprofen, Advil, Naproxen (Aleve), Motrin, BC and Goody Powder. Pt verbalized understanding of all pre-op instructions.

## 2018-04-10 ENCOUNTER — Ambulatory Visit (HOSPITAL_COMMUNITY): Payer: Medicare PPO | Admitting: Anesthesiology

## 2018-04-10 ENCOUNTER — Encounter (HOSPITAL_COMMUNITY): Payer: Self-pay | Admitting: Orthopedic Surgery

## 2018-04-10 ENCOUNTER — Encounter (HOSPITAL_COMMUNITY)
Admission: RE | Disposition: A | Discharge status: Home / Self Care | Payer: Self-pay | Source: Ambulatory Visit | Attending: Otolaryngology

## 2018-04-10 ENCOUNTER — Other Ambulatory Visit: Payer: Self-pay

## 2018-04-10 ENCOUNTER — Ambulatory Visit (HOSPITAL_COMMUNITY)
Admission: RE | Admit: 2018-04-10 | Discharge: 2018-04-10 | Disposition: A | Discharge status: Home / Self Care | Payer: Medicare PPO | Source: Ambulatory Visit | Attending: Otolaryngology | Admitting: Otolaryngology

## 2018-04-10 DIAGNOSIS — Z7901 Long term (current) use of anticoagulants: Secondary | ICD-10-CM | POA: Diagnosis not present

## 2018-04-10 DIAGNOSIS — C320 Malignant neoplasm of glottis: Secondary | ICD-10-CM | POA: Insufficient documentation

## 2018-04-10 DIAGNOSIS — D020 Carcinoma in situ of larynx: Secondary | ICD-10-CM | POA: Insufficient documentation

## 2018-04-10 DIAGNOSIS — Z79899 Other long term (current) drug therapy: Secondary | ICD-10-CM | POA: Insufficient documentation

## 2018-04-10 DIAGNOSIS — Z86718 Personal history of other venous thrombosis and embolism: Secondary | ICD-10-CM | POA: Diagnosis not present

## 2018-04-10 DIAGNOSIS — Z87891 Personal history of nicotine dependence: Secondary | ICD-10-CM | POA: Diagnosis not present

## 2018-04-10 HISTORY — PX: MICROLARYNGOSCOPY WITH LASER: SHX5972

## 2018-04-10 HISTORY — DX: Benign neoplasm of larynx: D14.1

## 2018-04-10 LAB — PROTIME-INR
INR: 1.2
Prothrombin Time: 15.1 seconds (ref 11.4–15.2)

## 2018-04-10 SURGERY — MICROLARYNGOSCOPY, WITH PROCEDURE USING LASER
Anesthesia: General | Site: Throat

## 2018-04-10 MED ORDER — ONDANSETRON HCL 4 MG/2ML IJ SOLN
INTRAMUSCULAR | Status: DC | PRN
  Administered 2018-04-10: 4 mg via INTRAVENOUS

## 2018-04-10 MED ORDER — PROPOFOL 10 MG/ML IV BOLUS
INTRAVENOUS | Status: DC | PRN
  Administered 2018-04-10: 150 mg via INTRAVENOUS

## 2018-04-10 MED ORDER — SUGAMMADEX SODIUM 200 MG/2ML IV SOLN
INTRAVENOUS | Status: AC
  Filled 2018-04-10: qty 2

## 2018-04-10 MED ORDER — FENTANYL CITRATE (PF) 250 MCG/5ML IJ SOLN
INTRAMUSCULAR | Status: DC | PRN
  Administered 2018-04-10: 100 ug via INTRAVENOUS

## 2018-04-10 MED ORDER — ROCURONIUM BROMIDE 100 MG/10ML IV SOLN
INTRAVENOUS | Status: DC | PRN
  Administered 2018-04-10: 20 mg via INTRAVENOUS
  Administered 2018-04-10: 30 mg via INTRAVENOUS

## 2018-04-10 MED ORDER — LIDOCAINE 2% (20 MG/ML) 5 ML SYRINGE
INTRAMUSCULAR | Status: AC
  Filled 2018-04-10: qty 5

## 2018-04-10 MED ORDER — FENTANYL CITRATE (PF) 100 MCG/2ML IJ SOLN: 25.0000 ug | INTRAMUSCULAR | Status: DC | PRN

## 2018-04-10 MED ORDER — LIDOCAINE HCL (CARDIAC) PF 100 MG/5ML IV SOSY
PREFILLED_SYRINGE | INTRAVENOUS | Status: DC | PRN
  Administered 2018-04-10: 80 mg via INTRAVENOUS

## 2018-04-10 MED ORDER — FENTANYL CITRATE (PF) 250 MCG/5ML IJ SOLN
INTRAMUSCULAR | Status: AC
  Filled 2018-04-10: qty 5

## 2018-04-10 MED ORDER — LACTATED RINGERS IV SOLN
INTRAVENOUS | Status: DC
  Administered 2018-04-10 (×2): via INTRAVENOUS

## 2018-04-10 MED ORDER — PROPOFOL 10 MG/ML IV BOLUS
INTRAVENOUS | Status: AC
  Filled 2018-04-10: qty 20

## 2018-04-10 MED ORDER — SUGAMMADEX SODIUM 200 MG/2ML IV SOLN
INTRAVENOUS | Status: DC | PRN
  Administered 2018-04-10: 180 mg via INTRAVENOUS

## 2018-04-10 MED ORDER — SUCCINYLCHOLINE CHLORIDE 20 MG/ML IJ SOLN
INTRAMUSCULAR | Status: DC | PRN
  Administered 2018-04-10: 100 mg via INTRAVENOUS

## 2018-04-10 MED ORDER — ONDANSETRON HCL 4 MG/2ML IJ SOLN
INTRAMUSCULAR | Status: AC
  Filled 2018-04-10: qty 2

## 2018-04-10 MED ORDER — EPINEPHRINE PF 1 MG/ML IJ SOLN
INTRAMUSCULAR | Status: DC | PRN
  Administered 2018-04-10: 30 mL

## 2018-04-10 MED ORDER — LIDOCAINE-EPINEPHRINE 1 %-1:100000 IJ SOLN
INTRAMUSCULAR | Status: DC | PRN
  Administered 2018-04-10: 30 mL

## 2018-04-10 MED ORDER — DEXAMETHASONE SODIUM PHOSPHATE 10 MG/ML IJ SOLN
INTRAMUSCULAR | Status: AC
  Filled 2018-04-10: qty 1

## 2018-04-10 MED ORDER — ROCURONIUM BROMIDE 10 MG/ML (PF) SYRINGE
PREFILLED_SYRINGE | INTRAVENOUS | Status: AC
  Filled 2018-04-10: qty 5

## 2018-04-10 MED ORDER — 0.9 % SODIUM CHLORIDE (POUR BTL) OPTIME
TOPICAL | Status: DC | PRN
  Administered 2018-04-10: 1000 mL

## 2018-04-10 MED ORDER — DEXAMETHASONE SODIUM PHOSPHATE 10 MG/ML IJ SOLN
INTRAMUSCULAR | Status: DC | PRN
  Administered 2018-04-10: 10 mg via INTRAVENOUS

## 2018-04-10 SURGICAL SUPPLY — 25 items
BLADE SURG 15 STRL LF DISP TIS (BLADE) IMPLANT
BLADE SURG 15 STRL SS (BLADE)
CANISTER SUCTION 1200CC (MISCELLANEOUS) ×3 IMPLANT
DRAPE HALF SHEET 40X57 (DRAPES) ×3 IMPLANT
GAUZE SPONGE 4X4 12PLY STRL (GAUZE/BANDAGES/DRESSINGS) IMPLANT
GLOVE ECLIPSE 7.5 STRL STRAW (GLOVE) ×3 IMPLANT
GOWN STRL REUS W/ TWL XL LVL3 (GOWN DISPOSABLE) IMPLANT
GOWN STRL REUS W/TWL 2XL LVL3 (GOWN DISPOSABLE) IMPLANT
GOWN STRL REUS W/TWL XL LVL3 (GOWN DISPOSABLE)
GUARD TEETH (MISCELLANEOUS) ×3 IMPLANT
KIT BASIN OR (CUSTOM PROCEDURE TRAY) ×3 IMPLANT
MARKER SKIN DUAL TIP RULER LAB (MISCELLANEOUS) ×3 IMPLANT
NEEDLE 22X1 1/2 (OR ONLY) (NEEDLE) ×6 IMPLANT
NEEDLE HYPO 18GX1.5 BLUNT FILL (NEEDLE) ×3 IMPLANT
NEEDLE PRECISIONGLIDE 27X1.5 (NEEDLE) ×3 IMPLANT
NS IRRIG 1000ML POUR BTL (IV SOLUTION) ×3 IMPLANT
PATTIES SURGICAL .5 X3 (DISPOSABLE) ×3 IMPLANT
SOLUTION BUTLER CLEAR DIP (MISCELLANEOUS) ×3 IMPLANT
SURGILUBE 2OZ TUBE FLIPTOP (MISCELLANEOUS) IMPLANT
SYR 5ML LL (SYRINGE) ×3 IMPLANT
SYR CONTROL 10ML LL (SYRINGE) ×3 IMPLANT
TOWEL OR 17X24 6PK STRL BLUE (TOWEL DISPOSABLE) ×3 IMPLANT
TUBE CONNECTING 20'X1/4 (TUBING) ×1
TUBE CONNECTING 20X1/4 (TUBING) ×2 IMPLANT
WATER STERILE IRR 1000ML POUR (IV SOLUTION) ×3 IMPLANT

## 2018-04-10 NOTE — Anesthesia Preprocedure Evaluation (Signed)
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)  + Valvular Problems/Murmurs AI  Rhythm:Regular Rate:Normal     Neuro/Psych negative neurological ROS     GI/Hepatic negative GI ROS, Neg liver ROS,   Endo/Other  negative endocrine ROS  Renal/GU negative Renal ROS     Musculoskeletal   Abdominal   Peds  Hematology negative hematology ROS (+)   Anesthesia Other Findings   Reproductive/Obstetrics                             Anesthesia Physical  Anesthesia Plan  ASA: III  Anesthesia Plan: General   Post-op Pain Management:    Induction: Intravenous  PONV Risk Score and Plan: 1 and Ondansetron, Treatment may vary due to age or medical condition and Dexamethasone  Airway Management Planned: Oral ETT  Additional Equipment:   Intra-op Plan:   Post-operative Plan: Extubation in OR  Informed Consent: I have reviewed the patients History and Physical, chart, labs and discussed the procedure including the risks, benefits and alternatives for the proposed anesthesia with the patient or authorized representative who has indicated his/her understanding and acceptance.   Dental advisory given  Plan Discussed with: CRNA and Anesthesiologist  Anesthesia Plan Comments:         Anesthesia Quick Evaluation

## 2018-04-10 NOTE — Discharge Instructions (Signed)
Avoid straining your voice for 2 weeks.  We will call with biopsy results when available next week.

## 2018-04-10 NOTE — Transfer of Care (Signed)
Immediate Anesthesia Transfer of Care Note  Patient: Kent Davis  Procedure(s) Performed: MICROLARYNGOSCOPY WITH LASER (N/A Throat)  Patient Location: PACU  Anesthesia Type:General  Level of Consciousness: awake, alert , oriented and patient cooperative  Airway & Oxygen Therapy: Patient Spontanous Breathing and Patient connected to nasal cannula oxygen  Post-op Assessment: Report given to RN and Post -op Vital signs reviewed and stable  Post vital signs: Reviewed and stable  Last Vitals:  Vitals Value Taken Time  BP    Temp    Pulse    Resp    SpO2      Last Pain:  Vitals:   04/10/18 0851  TempSrc: Oral         Complications: No apparent anesthesia complications

## 2018-04-10 NOTE — Interval H&P Note (Signed)
History and Physical Interval Note:  04/10/2018 10:52 AM  Kent Davis  has presented today for surgery, with the diagnosis of Carcinoma in situ larynx  The various methods of treatment have been discussed with the patient and family. After consideration of risks, benefits and other options for treatment, the patient has consented to  Procedure(s) with comments: MICROLARYNGOSCOPY WITH LASER (N/A) - with Biopsy as a surgical intervention .  The patient's history has been reviewed, patient examined, no change in status, stable for surgery.  I have reviewed the patient's chart and labs.  Questions were answered to the patient's satisfaction.     Izora Gala

## 2018-04-10 NOTE — Anesthesia Postprocedure Evaluation (Signed)
Anesthesia Post Note  Patient: Kent Davis  Procedure(s) Performed: MICROLARYNGOSCOPY WITH LASER (N/A Throat)     Patient location during evaluation: PACU Anesthesia Type: General Level of consciousness: awake and alert Pain management: pain level controlled Vital Signs Assessment: post-procedure vital signs reviewed and stable Respiratory status: spontaneous breathing, nonlabored ventilation, respiratory function stable and patient connected to nasal cannula oxygen Cardiovascular status: blood pressure returned to baseline and stable Postop Assessment: no apparent nausea or vomiting Anesthetic complications: no    Last Vitals:  Vitals:   04/10/18 1430 04/10/18 1440  BP: 129/72 129/72  Pulse: 70 72  Resp: 18 (!) 21  Temp:  36.7 C  SpO2: 95% 95%    Last Pain:  Vitals:   04/10/18 1430  TempSrc:   PainSc: 0-No pain                 Tiajuana Amass

## 2018-04-10 NOTE — Anesthesia Procedure Notes (Addendum)
Procedure Name: Intubation Date/Time: 04/10/2018 12:55 PM Performed by: Josephine Igo, CRNA Pre-anesthesia Checklist: Patient identified, Suction available, Patient being monitored and Emergency Drugs available Patient Re-evaluated:Patient Re-evaluated prior to induction Oxygen Delivery Method: Circle system utilized Preoxygenation: Pre-oxygenation with 100% oxygen Induction Type: IV induction and Rapid sequence Ventilation: Mask ventilation without difficulty Laryngoscope Size: Miller and 2 Grade View: Grade I Tube type: Oral Tube size: 6.0 mm Number of attempts: 1 Airway Equipment and Method: Stylet Placement Confirmation: ETT inserted through vocal cords under direct vision and positive ETCO2 Secured at: 24 cm Tube secured with: Tape Dental Injury: Teeth and Oropharynx as per pre-operative assessment

## 2018-04-10 NOTE — Op Note (Signed)
OPERATIVE REPORT  DATE OF SURGERY: 04/10/2018  PATIENT:  Loletta Parish,  77 y.o. male  PRE-OPERATIVE DIAGNOSIS:  Carcinoma in situ larynx  POST-OPERATIVE DIAGNOSIS:  Carcinoma in situ larynx  PROCEDURE:  Procedure(s): MICROLARYNGOSCOPY WITH LASER  SURGEON:  Beckie Salts, MD  ASSISTANTS: none  ANESTHESIA:   General   EBL:  5 ml  DRAINS: none  LOCAL MEDICATIONS USED:  None  SPECIMEN: 1.  Right mid vocal cord lesion; 2.  Anterior commissure lesion.  COUNTS:  Correct  PROCEDURE DETAILS: The patient was taken to the operating room and placed on the operating table in the supine position. Following induction of general endotracheal anesthesia, the table was turned 90 and the patient was draped in a standard fashion.  A maxillary tooth protector was used throughout the case.  A laser safe laryngoscope was entered into the oral cavity used to examine the larynx and suspended to the [employer] stand.  The operating microscope was brought into view.  Photographs were taken using a rod endoscope.  The larynx was inspected and the 2 lesions were identified.  Biopsies were taken of both lesions, effectively grossly excising them completely.  The CO2 laser was then attached and a setting of 2 W continuous power was used to ablate additional abnormal lining and the base of the 2 lesions.  Topical adrenaline on pledgets was used for hemostasis.  The scope was removed and the patient was awakened extubated and transferred to recovery in stable condition.    PATIENT DISPOSITION:  To PACU, stable

## 2018-04-11 ENCOUNTER — Encounter (HOSPITAL_COMMUNITY): Payer: Self-pay | Admitting: Otolaryngology

## 2018-06-04 MED FILL — Epinephrine Inj 30 MG/30ML: INTRAMUSCULAR | Qty: 30 | Status: AC

## 2018-06-09 ENCOUNTER — Inpatient Hospital Stay
Admission: RE | Admit: 2018-06-09 | Discharge: 2018-06-09 | Disposition: A | Discharge status: Home / Self Care | Payer: Medicare PPO | Source: Ambulatory Visit

## 2018-06-09 ENCOUNTER — Other Ambulatory Visit (HOSPITAL_COMMUNITY): Payer: Self-pay | Admitting: Otolaryngology

## 2018-06-09 ENCOUNTER — Ambulatory Visit
Admission: RE | Admit: 2018-06-09 | Discharge: 2018-06-09 | Disposition: A | Discharge status: Home / Self Care | Payer: Medicare PPO | Source: Ambulatory Visit | Attending: Otolaryngology | Admitting: Otolaryngology

## 2018-06-09 DIAGNOSIS — D491 Neoplasm of unspecified behavior of respiratory system: Secondary | ICD-10-CM

## 2018-07-13 ENCOUNTER — Ambulatory Visit
Admission: RE | Admit: 2018-07-13 | Discharge: 2018-07-13 | Disposition: A | Discharge status: Home / Self Care | Payer: Self-pay | Source: Ambulatory Visit | Attending: Otolaryngology | Admitting: Otolaryngology

## 2018-07-13 ENCOUNTER — Other Ambulatory Visit (HOSPITAL_COMMUNITY): Payer: Self-pay | Admitting: Otolaryngology

## 2018-07-13 DIAGNOSIS — C801 Malignant (primary) neoplasm, unspecified: Secondary | ICD-10-CM

## 2018-08-11 ENCOUNTER — Encounter (HOSPITAL_COMMUNITY): Payer: Self-pay

## 2018-09-17 ENCOUNTER — Encounter (HOSPITAL_COMMUNITY): Payer: Self-pay

## 2018-11-26 IMAGING — MR MR OUTSIDE FILMS SOFT TISSUE NECK
12 series · 48 of 48 positions shown · non-contrast
Comparison: none

[Series 2: T1 · coronal · 3.0mm · 0.81mm/px · 5 of 60 slices shown (1 of 5)]
[im 1/60]
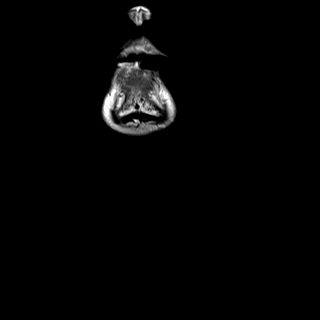
[im 15/60]
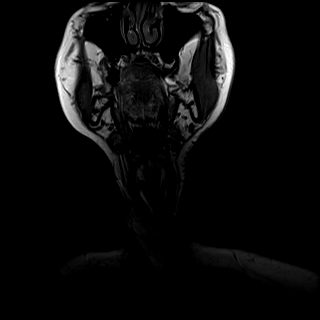
[im 30/60]
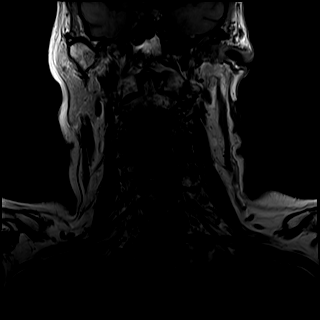
[im 45/60]
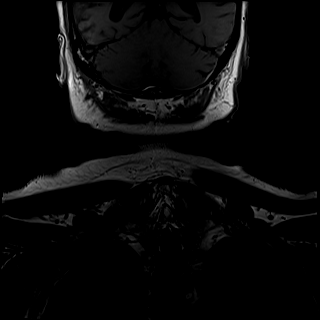
[im 60/60]
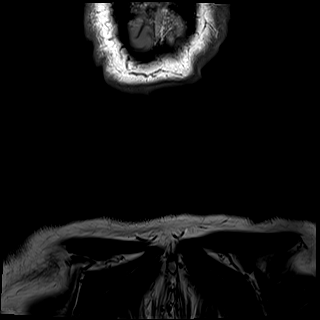

[Series 3: T2 · coronal · 3.0mm · 0.81mm/px · 4 of 60 slices shown (1 of 4)]
[im 1/60]
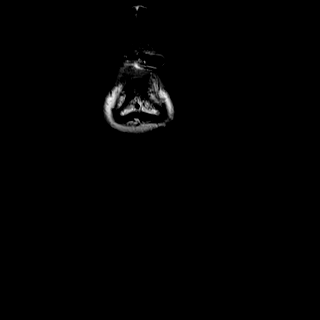
[im 20/60]
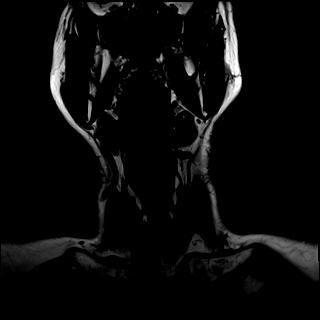
[im 40/60]
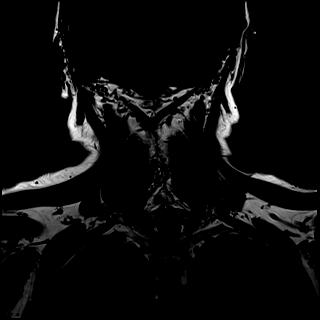
[im 60/60]
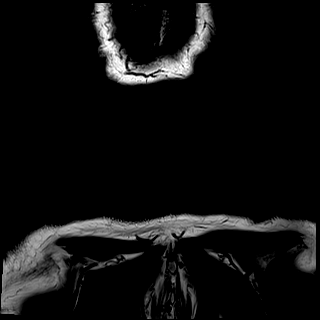

[Series 4: T2 · coronal · 3.0mm · 0.81mm/px · 4 of 60 slices shown (2 of 4)]
[im 1/60]
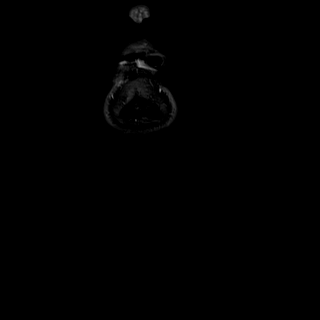
[im 20/60]
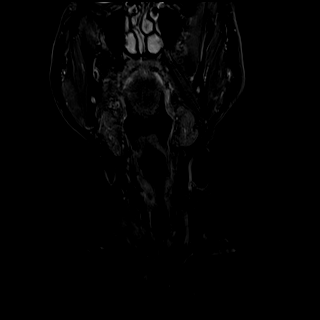
[im 40/60]
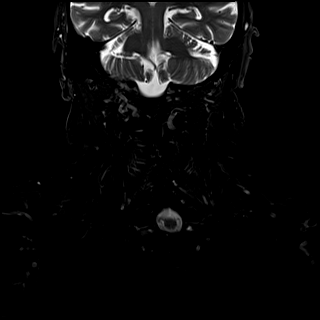
[im 60/60]
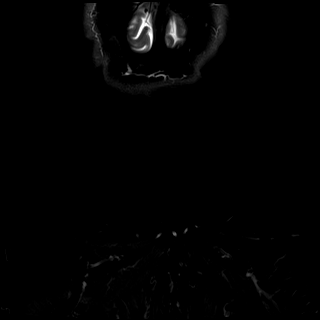

[Series 5: T1 · axial · 3.0mm · 0.81mm/px · z∈[-85,+149]mm · 4 of 72 slices shown (2 of 5)]
[im 1/72]
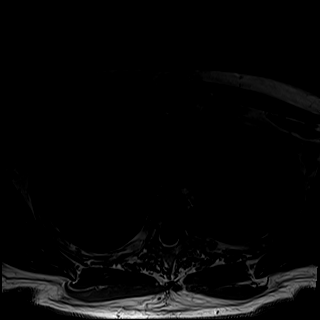
[im 24/72]
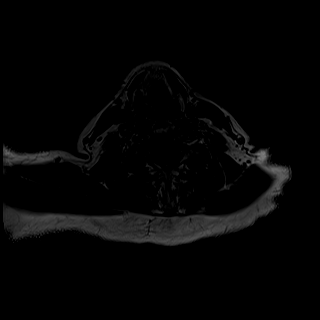
[im 48/72]
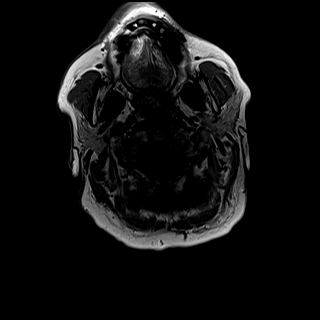
[im 72/72]
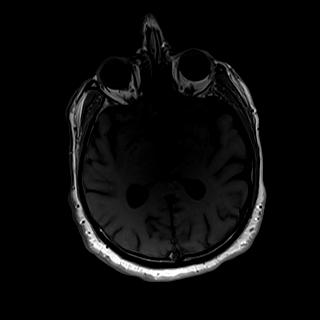

[Series 6: T2 · axial · 3.0mm · 0.81mm/px · z∈[-85,+149]mm · 4 of 72 slices shown (3 of 4)]
[im 1/72]
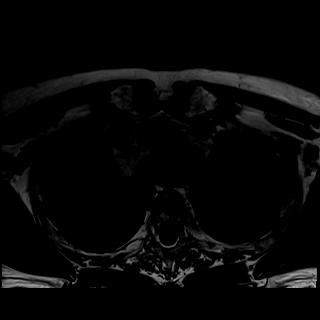
[im 24/72]
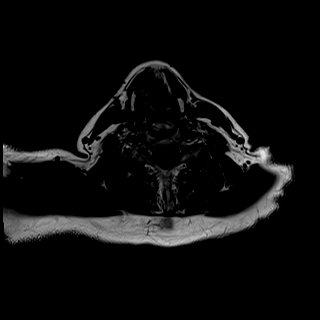
[im 48/72]
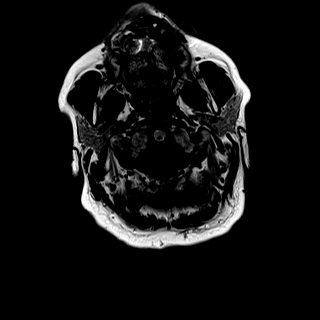
[im 72/72]
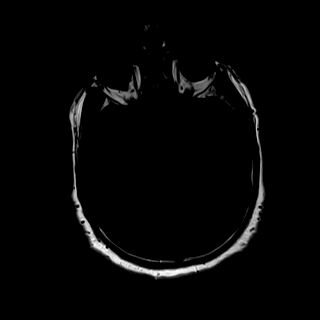

[Series 7: T2 · axial · 3.0mm · 0.81mm/px · z∈[-85,+149]mm · 4 of 72 slices shown (4 of 4)]
[im 1/72]
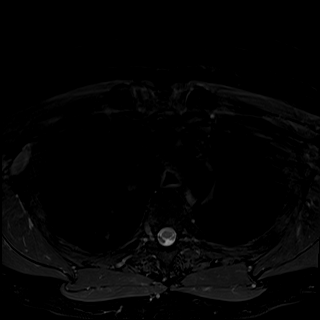
[im 24/72]
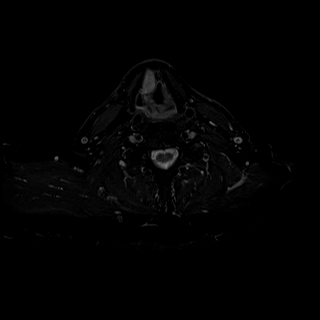
[im 48/72]
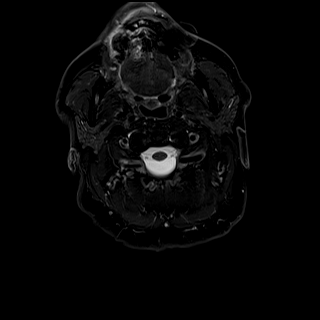
[im 72/72]
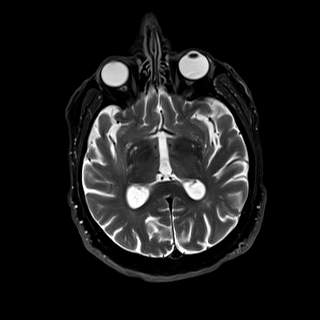

[Series 8: T1 · sagittal · 3.0mm · 0.81mm/px · 3 of 50 slices shown (3 of 5)]
[im 1/50]
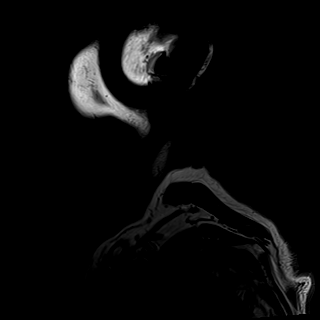
[im 25/50]
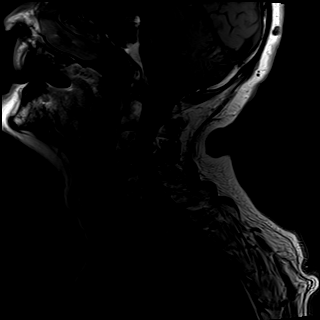
[im 50/50]
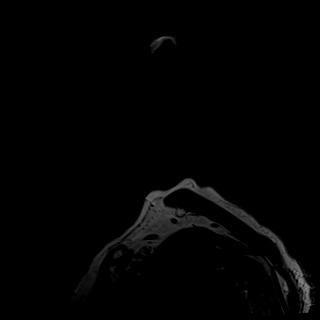

[Series 9: T1 · axial · 3.0mm · 1.17mm/px · z∈[-87,+147]mm · 4 of 72 slices shown (4 of 5)]
[im 1/72]
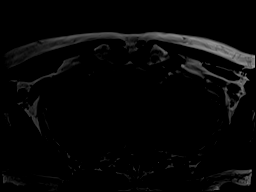
[im 24/72]
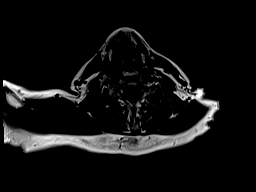
[im 48/72]
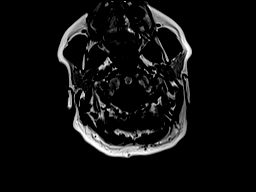
[im 72/72]
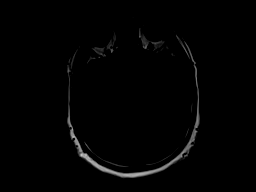

[Series 10: T1 · axial · 3.0mm · 1.17mm/px · z∈[-87,+147]mm · 4 of 72 slices shown (5 of 5)]
[im 1/72]
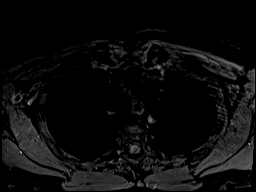
[im 24/72]
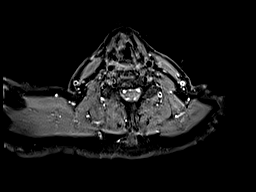
[im 48/72]
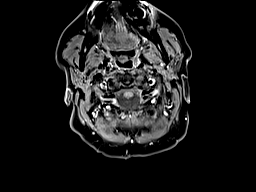
[im 72/72]
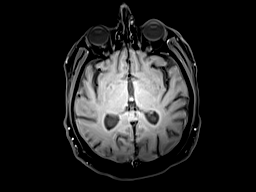

[Series 11: T1 fat-sat · axial · 3.0mm · 0.81mm/px · z∈[-85,+149]mm · 4 of 72 slices shown (1 of 2)]
[im 1/72]
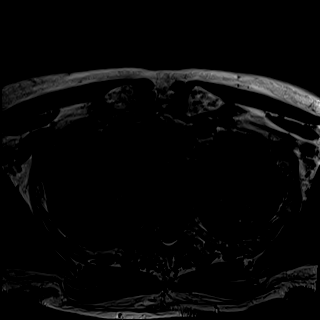
[im 24/72]
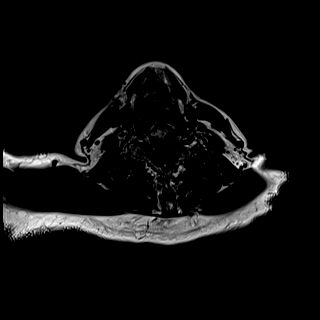
[im 48/72]
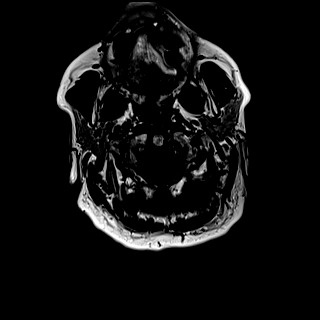
[im 72/72]
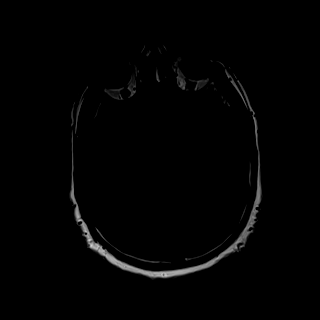

[Series 12: T1 fat-sat · axial · 3.0mm · 0.81mm/px · z∈[-85,+149]mm · 4 of 72 slices shown (2 of 2)]
[im 1/72]
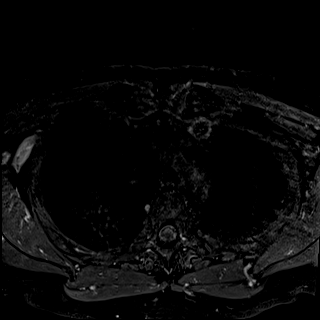
[im 24/72]
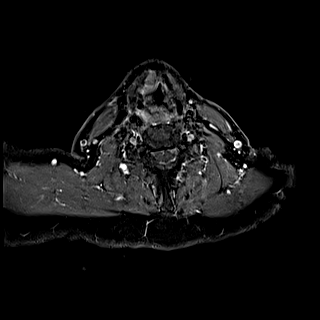
[im 48/72]
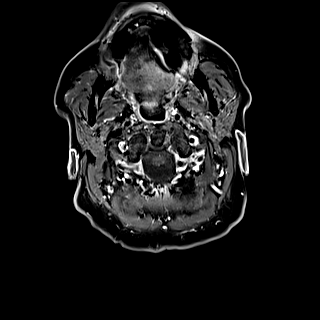
[im 72/72]
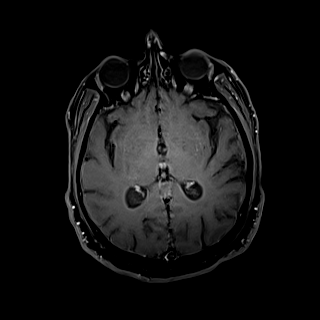

[Series 13: T1 fat-sat post-contrast · coronal · 3.0mm · 0.81mm/px · 4 of 60 slices shown]
[im 1/60]
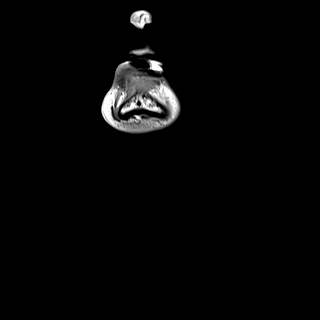
[im 20/60]
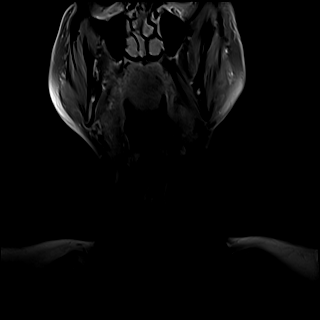
[im 40/60]
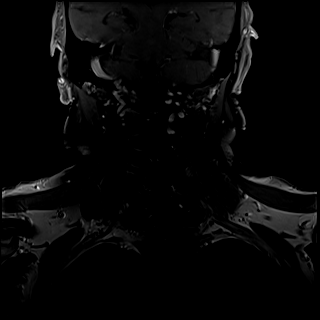
[im 60/60]
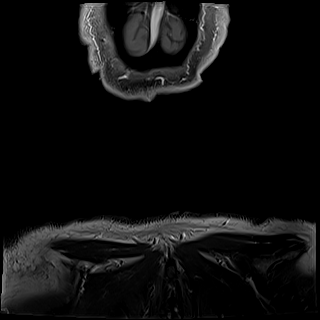

[48 of 48 positions shown; findings below may reference images not displayed]

Canned report from images found in remote index.

Refer to host system for actual result text.
